# Patient Record
Sex: Female | Born: 1985 | Race: White | Hispanic: No | Marital: Married | Smoking: Never smoker
Health system: Southern US, Community
[De-identification: ages and names within clinical notes are randomized; demographics above are authoritative.]

## PROBLEM LIST (undated history)

## (undated) ENCOUNTER — Inpatient Hospital Stay (HOSPITAL_COMMUNITY): Payer: Self-pay

## (undated) DIAGNOSIS — K5150 Left sided colitis without complications: Secondary | ICD-10-CM

## (undated) DIAGNOSIS — E559 Vitamin D deficiency, unspecified: Secondary | ICD-10-CM

## (undated) DIAGNOSIS — K5100 Ulcerative (chronic) pancolitis without complications: Secondary | ICD-10-CM

## (undated) DIAGNOSIS — D500 Iron deficiency anemia secondary to blood loss (chronic): Secondary | ICD-10-CM

## (undated) DIAGNOSIS — N920 Excessive and frequent menstruation with regular cycle: Secondary | ICD-10-CM

## (undated) DIAGNOSIS — K602 Anal fissure, unspecified: Secondary | ICD-10-CM

## (undated) DIAGNOSIS — Z8759 Personal history of other complications of pregnancy, childbirth and the puerperium: Secondary | ICD-10-CM

## (undated) DIAGNOSIS — D693 Immune thrombocytopenic purpura: Secondary | ICD-10-CM

## (undated) HISTORY — DX: Left sided colitis without complications: K51.50

## (undated) HISTORY — DX: Excessive and frequent menstruation with regular cycle: N92.0

## (undated) HISTORY — DX: Anal fissure, unspecified: K60.2

## (undated) HISTORY — PX: TONSILLECTOMY: SUR1361

## (undated) HISTORY — DX: Immune thrombocytopenic purpura: D69.3

## (undated) HISTORY — DX: Iron deficiency anemia secondary to blood loss (chronic): D50.0

## (undated) HISTORY — DX: Vitamin D deficiency, unspecified: E55.9

## (undated) HISTORY — DX: Personal history of other complications of pregnancy, childbirth and the puerperium: Z87.59

---
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
## 2013-02-11 ENCOUNTER — Encounter: Payer: Self-pay | Admitting: Internal Medicine

## 2013-03-11 ENCOUNTER — Encounter: Payer: Self-pay | Admitting: Internal Medicine

## 2013-03-11 ENCOUNTER — Other Ambulatory Visit (INDEPENDENT_AMBULATORY_CARE_PROVIDER_SITE_OTHER): Payer: PRIVATE HEALTH INSURANCE

## 2013-03-11 ENCOUNTER — Ambulatory Visit (INDEPENDENT_AMBULATORY_CARE_PROVIDER_SITE_OTHER): Payer: PRIVATE HEALTH INSURANCE | Admitting: Internal Medicine

## 2013-03-11 VITALS — BP 106/80 | HR 68 | Ht 75.0 in | Wt 205.6 lb

## 2013-03-11 DIAGNOSIS — Z862 Personal history of diseases of the blood and blood-forming organs and certain disorders involving the immune mechanism: Secondary | ICD-10-CM

## 2013-03-11 DIAGNOSIS — R197 Diarrhea, unspecified: Secondary | ICD-10-CM

## 2013-03-11 DIAGNOSIS — K5150 Left sided colitis without complications: Secondary | ICD-10-CM

## 2013-03-11 DIAGNOSIS — K625 Hemorrhage of anus and rectum: Secondary | ICD-10-CM

## 2013-03-11 HISTORY — DX: Left sided colitis without complications: K51.50

## 2013-03-11 LAB — COMPREHENSIVE METABOLIC PANEL
ALT: 18 U/L (ref 0–35)
AST: 19 U/L (ref 0–37)
Alkaline Phosphatase: 60 U/L (ref 39–117)
Sodium: 139 mEq/L (ref 135–145)
Total Bilirubin: 0.6 mg/dL (ref 0.3–1.2)
Total Protein: 6.8 g/dL (ref 6.0–8.3)

## 2013-03-11 LAB — CBC WITH DIFFERENTIAL/PLATELET
Basophils Absolute: 0 10*3/uL (ref 0.0–0.1)
HCT: 36.7 % (ref 36.0–46.0)
Lymphs Abs: 1.9 10*3/uL (ref 0.7–4.0)
MCV: 86.8 fl (ref 78.0–100.0)
Monocytes Absolute: 0.7 10*3/uL (ref 0.1–1.0)
Platelets: 268 10*3/uL (ref 150.0–400.0)
RDW: 13.7 % (ref 11.5–14.6)

## 2013-03-11 MED ORDER — NA SULFATE-K SULFATE-MG SULF 17.5-3.13-1.6 GM/177ML PO SOLN: ORAL | Status: DC

## 2013-03-11 NOTE — Patient Instructions (Addendum)
You have been scheduled for a colonoscopy with propofol. Please follow written instructions given to you at your visit today.  Please pick up your prep kit at the pharmacy within the next 1-3 days. If you use inhalers (even only as needed), please bring them with you on the day of your procedure. Your physician has requested that you go to www.startemmi.com and enter the access code given to you at your visit today. This web site gives a general overview about your procedure. However, you should still follow specific instructions given to you by our office regarding your preparation for the procedure.  Your physician has requested that you go to the basement for the following lab work before leaving today: CBC,CMET, C-Reactive Protein  I appreciate the opportunity to care for you.

## 2013-03-11 NOTE — Progress Notes (Signed)
Subjective:    Patient ID: Deanna Willis, female    DOB: [birth year], 27 y.o.   MRN: [id number]  HPI Is a very nice 27 year old married white woman here with a three-year history of rectal bleeding and small amounts to moderate amounts of blood in the stool with cramps before defecation mucus. Stools are loose and diarrheal. About 3 years ago she was having some rectal bleeding and salt another gastroenterologist, was told she probably had an anal fissure and was prescribed a topical therapy. That didn't seem to help and she did not want to return there so she didn't. Over time things as worsened and she now has the loose stools, she cannot differentiate between gas and producing mucus or loose bloody stools. She has crampy abdominal pain. This occurs several times a day, she has about 5 bowel movements a day that are very small and loose as described.  No other pertinent GI history. She does have mild anal discomfort at times but nothing severe when she defecates.  She does have a history of idiopathic thrombocytopenia purpura, and what she says was a tendency towards von Willebrand's. She had 2 episodes of severe menorrhagia as a teenager, that were treated successfully with platelet infusion but she's not had problems like that in many years. Prior to that she tolerated a tonsillectomy at age 85 without serious bleeding. She has not had any invasive procedures since. She does have rare episodes of petechia, she said she had some significant itching and scratched it was in the last couple months and produce some petechia and will do bleeding on the skin. This is a rare event.  Allergies  Allergen Reactions  . Phenergan (Promethazine Hcl)     Fainting, syncope   No outpatient prescriptions prior to visit.   No facility-administered medications prior to visit.   Past Medical History  Diagnosis Date  . ITP (idiopathic thrombocytopenic purpura)     Question of associated tendency towards von  Willebrand  . Menorrhagia     Associated with thrombocytopenia, 2 episodes in teenage years responded the platelet infusion   Past Surgical History  Procedure Laterality Date  . Tonsillectomy     History   Social History  . Marital Status: Married    Spouse Name: N/A    Number of Children:  none   .     Occupational History  . Hairstylist    Social History Main Topics  . Smoking status: Never Smoker   . Smokeless tobacco: Never Used  . Alcohol Use: Yes  . Drug Use: No  . 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 Diabetes Father   . Diabetes Paternal Grandfather   . Diabetes Maternal Grandmother   . Heart disease Paternal Grandfather   . Heart disease Maternal Grandmother   . Irritable bowel syndrome Paternal Grandmother     Review of Systems As mentioned in the history of present illness, she also has some fatigue and myalgias at times. All other review of systems are negative.    Objective:   Physical Exam General:  Well-developed, well-nourished and in no acute distress Eyes:  anicteric. ENT:   Mouth and posterior pharynx free of lesions.  Neck:   supple w/o thyromegaly or mass.  Lungs: Clear to auscultation bilaterally. Heart:  S1S2, no rubs, murmurs, gallops. Abdomen:  soft,  non-tender, no hepatosplenomegaly, hernia, or mass and BS+.  Rectal: This is deferred until colonoscopy Lymph:  no cervical or supraclavicular adenopathy. Extremities:   no edema Skin   no rash. Neuro:  A&O x 3.  Psych:  appropriate mood and  Affect.      Assessment & Plan:   1. Rectal bleeding   2. Diarrhea   3. History of thrombocytopenia    1. The overall scenario suspicious for inflammatory bowel disease. This is explained to the patient and her husband who is with her today. 2. She will be scheduled for a colonoscopy.The risks and benefits as well as alternatives of  endoscopic procedure(s) have been discussed and reviewed. All questions answered. The patient agrees to proceed. 3. Also check a CBC, CMET and C-reactive protein. 4. Keep in mind his history of bleeding problems, my sense is that colonoscopy with mucosal biopsy should be okay. Certainly if her platelets are significantly low she could need something else. She could need further hematology followup depending upon will define as well.

## 2013-03-11 NOTE — Progress Notes (Signed)
Quick Note:  Labs are okay Please inform patient within the next few days ______

## 2013-03-24 ENCOUNTER — Ambulatory Visit (AMBULATORY_SURGERY_CENTER): Payer: Self-pay | Admitting: Internal Medicine

## 2013-03-24 ENCOUNTER — Encounter: Payer: Self-pay | Admitting: Internal Medicine

## 2013-03-24 VITALS — BP 117/58 | HR 56 | Temp 97.7°F | Resp 20 | Ht 75.0 in | Wt 205.0 lb

## 2013-03-24 DIAGNOSIS — K5190 Ulcerative colitis, unspecified, without complications: Secondary | ICD-10-CM

## 2013-03-24 DIAGNOSIS — R197 Diarrhea, unspecified: Secondary | ICD-10-CM

## 2013-03-24 DIAGNOSIS — K625 Hemorrhage of anus and rectum: Secondary | ICD-10-CM

## 2013-03-24 HISTORY — PX: COLONOSCOPY W/ BIOPSIES: SHX1374

## 2013-03-24 MED ORDER — SODIUM CHLORIDE 0.9 % IV SOLN: 500.0000 mL | INTRAVENOUS | Status: DC

## 2013-03-24 MED ORDER — MESALAMINE 1000 MG RE SUPP: 1000.0000 mg | Freq: Every day | RECTAL | Status: DC

## 2013-03-24 MED ORDER — MESALAMINE 1.2 G PO TBEC: 2.4000 g | DELAYED_RELEASE_TABLET | Freq: Every day | ORAL | Status: DC

## 2013-03-24 NOTE — Progress Notes (Signed)
Called to room to assist during endoscopic procedure.  Patient ID and intended procedure confirmed with present staff. Received instructions for my participation in the procedure from the performing physician.  

## 2013-03-24 NOTE — Progress Notes (Signed)
Lidocaine-40mg IV prior to Propofol InductionPropofol given over incremental dosages 

## 2013-03-24 NOTE — Patient Instructions (Addendum)
YOU HAD AN ENDOSCOPIC PROCEDURE TODAY AT THE [facility]: Refer to the procedure report that was given to you for any specific questions about what was found during the examination.  If the procedure report does not answer your questions, please call your gastroenterologist to clarify.  If you requested that your care partner not be given the details of your procedure findings, then the procedure report has been included in a sealed envelope for you to review at your convenience later.  YOU SHOULD EXPECT: Some feelings of bloating in the abdomen. Passage of more gas than usual.  Walking can help get rid of the air that was put into your GI tract during the procedure and reduce the bloating. If you had a lower endoscopy (such as a colonoscopy or flexible sigmoidoscopy) you may notice spotting of blood in your stool or on the toilet paper. If you underwent a bowel prep for your procedure, then you may not have a normal bowel movement for a few days.  DIET: Your first meal following the procedure should be a light meal and then it is ok to progress to your normal diet.  A half-sandwich or bowl of soup is an example of a good first meal.  Heavy or fried foods are harder to digest and may make you feel nauseous or bloated.  Likewise meals heavy in dairy and vegetables can cause extra gas to form and this can also increase the bloating.  Drink plenty of fluids but you should avoid alcoholic beverages for 24 hours.  ACTIVITY: Your care partner should take you home directly after the procedure.  You should plan to take it easy, moving slowly for the rest of the day.  You can resume normal activity the day after the procedure however you should NOT DRIVE or use heavy machinery for 24 hours (because of the sedation medicines used during the test).    SYMPTOMS TO REPORT IMMEDIATELY: A gastroenterologist can be reached at any hour.  During normal business hours, 8:30 AM to 5:00 PM Monday through Friday,  call [phone].  After hours and on weekends, please call the GI answering service at [phone] who will take a message and have the physician on call contact you.   Following lower endoscopy (colonoscopy or flexible sigmoidoscopy):  Excessive amounts of blood in the stool  Significant tenderness or worsening of abdominal pains  Swelling of the abdomen that is new, acute  Fever of 100F or higher  FOLLOW UP: If any biopsies were taken you will be contacted by phone or by letter within the next 1-3 weeks.  Call your gastroenterologist if you have not heard about the biopsies in 3 weeks.  Our staff will call the home number listed on your records the next business day following your procedure to check on you and address any questions or concerns that you may have at that time regarding the information given to you following your procedure. This is a courtesy call and so if there is no answer at the home number and we have not heard from you through the emergency physician on call, we will assume that you have returned to your regular daily activities without incident.  SIGNATURES/CONFIDENTIALITY: You and/or your care partner have signed paperwork which will be entered into your electronic medical record.  These signatures attest to the fact that that the information above on your After Visit Summary has been reviewed and is understood.  Full responsibility of the confidentiality of this   discharge information lies with you and/or your care-partner.  I found changes that look like ulcerative colitis - at the end of the colon and rectum. There was a small area of inflammation at the appendix also. 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  I am prescribing a medicated suppository to be inserted every night and also will start some Lialda - samples from my office - take 2 tablets every AM.  My office will contact you with biopsy results and arrange follow-up - you should  hear by next week.  This is treatable but requires chronic treatment, we need to give 8 weeks or more to see results of this treatment plan - I anticipate moving to all oral therapy if possible, and it usually is.  PooledIncome.pl is an excellent website to learn more from.  I appreciate the opportunity to care for you. Iva Boop, MD, [facility]

## 2013-03-24 NOTE — Progress Notes (Signed)
Patient did not experience any of the following events: a burn prior to discharge; a fall within the facility; wrong site/side/patient/procedure/implant event; or a hospital transfer or hospital admission upon discharge from the facility. (G8907) Patient did not have preoperative order for IV antibiotic SSI prophylaxis. (G8918)  

## 2013-03-24 NOTE — Op Note (Signed)
[facility] [address] [place], [address]   COLONOSCOPY PROCEDURE REPORT  PATIENT: Deanna Willis, Deanna Willis  MR#: [id number] BIRTHDATE: [birth year] , 26  yrs. old GENDER: Female ENDOSCOPIST: Iva Boop, MD, [facility] PROCEDURE DATE:  03/24/2013 PROCEDURE:   Colonoscopy with biopsy ASA CLASS:   Class I INDICATIONS:Rectal Bleeding and chronic diarrhea. MEDICATIONS: propofol (Diprivan) 400mg  IV, MAC sedation, administered by CRNA, and These medications were titrated to patient response per physician's verbal order  DESCRIPTION OF PROCEDURE:   After the risks benefits and alternatives of the procedure were thoroughly explained, informed consent was obtained.  A digital rectal exam revealed no abnormalities of the rectum.   The LB [id number]  endoscope was introduced through the anus and advanced to the terminal ileum which was intubated for a short distance. No adverse events experienced.   The quality of the prep was Suprep good  The instrument was then slowly withdrawn as the colon was fully examined.    COLON FINDINGS: Non-bleeding mucosal ulceration, continuous across the area examined, was present in the rectum.  Multiple biopsies were performed using cold forceps.   Non-bleeding mucosal ulceration, continuous across the area examined, was present in the sigmoid colon.  Multiple biopsies were performed.  Transition at 25 cm to normal colon.  Non-bleeding mucosal ulceration, intermittent across the area examined, was present at the appendiceal orifice. Multiple biopsies were performed using cold forceps.   The mucosa appeared normal in the terminal ileum.   The colon mucosa was otherwise normal, right colon retroflexion perdformed.  Random biopsies taken from normal areas. Retroflexed rectal views revealed proctitis. The time to cecum=3 minutes 20 seconds.  Withdrawal time=7 minutes 30 seconds.  The scope was withdrawn and the procedure  completed. COMPLICATIONS: There were no complications.  ENDOSCOPIC IMPRESSION: 1.   Non-bleeding mucosal ulceration in the rectum; multiple biopsies were performed using cold forceps 2.   Non-bleeding mucosal ulceration in the sigmoid colon; multiple biopsies were performed 3.   Non-bleeding mucosal ulceration at the appendiceal orifice; multiple biopsies were performed using cold forceps 4.   Normal mucosa in the terminal ileum 5.   The colon mucosa was otherwise normal  RECOMMENDATIONS: 1.  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 2.   Start Canasa 1000 mg nightly and Lialda 2.4 g daily   eSigned:  Iva Boop, MD, [facility] 03/24/2013 12:11 PM   cc: The Patient

## 2013-03-24 NOTE — Progress Notes (Signed)
Dr. Leone Payor and CRNA Brennan Bailey made aware that pt. Had 5 sips of water at 10:00 a.m. Pt. Denied drinking large amounts of water. Pt. Noted with tatoo on right inner forearm.

## 2013-03-25 ENCOUNTER — Telehealth: Payer: Self-pay | Admitting: Internal Medicine

## 2013-03-25 ENCOUNTER — Telehealth: Payer: Self-pay | Admitting: *Deleted

## 2013-03-25 NOTE — Telephone Encounter (Signed)
Left message that we called for f/u 

## 2013-03-25 NOTE — Telephone Encounter (Signed)
Dr Leone Payor, are there any alternatives to canasa that patient may try? They will cost over $600.00 monthly.

## 2013-03-25 NOTE — Telephone Encounter (Signed)
Yes - too expensive Lets see how the Lialda alone does

## 2013-03-25 NOTE — Telephone Encounter (Signed)
Left message for patient to call back  

## 2013-03-26 NOTE — Telephone Encounter (Signed)
Patient advised of Dr Marvell Fuller recommendations and she verbalizes understanding.

## 2013-04-01 ENCOUNTER — Encounter: Payer: Self-pay | Admitting: Internal Medicine

## 2013-04-01 NOTE — Progress Notes (Signed)
Quick Note:  Please let her know that biopsies agree that she has IBD - ulcerative colitis See me in 6-8 weeks - I would hope she is improved by end of this month if not getting better at about time she is to refill Lialda I will want to increase dose so have her call if not improving   [facility] no letter or recall ______

## 2013-05-12 ENCOUNTER — Ambulatory Visit (INDEPENDENT_AMBULATORY_CARE_PROVIDER_SITE_OTHER): Payer: Self-pay | Admitting: Internal Medicine

## 2013-05-12 ENCOUNTER — Encounter: Payer: Self-pay | Admitting: Internal Medicine

## 2013-05-12 VITALS — BP 110/80 | HR 80 | Ht 73.0 in | Wt 191.4 lb

## 2013-05-12 DIAGNOSIS — K5190 Ulcerative colitis, unspecified, without complications: Secondary | ICD-10-CM

## 2013-05-12 DIAGNOSIS — K5150 Left sided colitis without complications: Secondary | ICD-10-CM

## 2013-05-12 MED ORDER — MESALAMINE 1.2 G PO TBEC: 1.2000 g | DELAYED_RELEASE_TABLET | Freq: Every day | ORAL | Status: DC

## 2013-05-12 MED ORDER — MESALAMINE 1.2 G PO TBEC: 4.8000 g | DELAYED_RELEASE_TABLET | Freq: Every day | ORAL | Status: DC

## 2013-05-12 NOTE — Assessment & Plan Note (Addendum)
Improved but still with bleeding and mucous. Increase Lialda to 4.8 g daily and see me in 2 months If this is not successful consider short term rectal steroid vs. oral Could need a flex sig to guide - would definitely do that before starting more aggressive Tx. At some point needs vit D level - consider other immunizations, etc in future....especially if thinking about more immunosuppression

## 2013-05-12 NOTE — Progress Notes (Signed)
  Subjective:    Patient ID: Deanna Willis, female    DOB: 07-May-1986, 27 y.o.   MRN: [id number]  HPI Deanna Willis returns about 6 weeks after dx of left-sided ulcerative colitis mad at colonoscopy. She has been on Lialda 2.4 g daily. She has less urgency to defecate. She has noticed that it may be hard to have a "good" emptying bowel movement. She still has some rectal bleeding and mucous. She is definitely better, though. Less abdominal cramping, largely attributed to a diet for UC - she purchased a book about this - no gluten or dairy and other foods.  Canasa suppositories were rxed but were too expensive.  Medications, allergies, past medical history, past surgical history, family history and social history are reviewed and updated in the EMR.  Review of Systems As above    Objective:   Physical Exam WDWN NAD    Assessment & Plan:

## 2013-05-12 NOTE — Patient Instructions (Addendum)
We are giving you samples and also a printed rx for the Lialda.  Take 4.8g daily, either at one time or split up the dose.  We are also providing you with a coupon you may be able to use.  Follow up with [place] in 2 months.  I appreciate the opportunity to care for you.

## 2013-05-28 ENCOUNTER — Encounter: Payer: Self-pay | Admitting: Internal Medicine

## 2013-06-01 ENCOUNTER — Ambulatory Visit (INDEPENDENT_AMBULATORY_CARE_PROVIDER_SITE_OTHER): Payer: Self-pay | Admitting: Nurse Practitioner

## 2013-06-01 ENCOUNTER — Encounter: Payer: Self-pay | Admitting: Nurse Practitioner

## 2013-06-01 VITALS — BP 102/66 | HR 64 | Ht 72.0 in | Wt 191.6 lb

## 2013-06-01 DIAGNOSIS — K5150 Left sided colitis without complications: Secondary | ICD-10-CM

## 2013-06-01 DIAGNOSIS — K602 Anal fissure, unspecified: Secondary | ICD-10-CM

## 2013-06-01 NOTE — Patient Instructions (Addendum)
Willette Cluster NP-C will discuss another medication with Dr. Leone Payor.  We may call something else in different than Lialda.  We have given you samples also. We will also call in Prednisone and a Nitroglycerine ointment.   We made you a follow up appointment with Dr. Leone Payor for 06-29-2013 at 1:30 PM .

## 2013-06-01 NOTE — Progress Notes (Signed)
  History of Present Illness:  Deanna Willis is a 27 year old female diagnosed with UC early July. She ws started on 2 Lialda daily but still symptomatic at last visit mid August so dose increased to 4 tablets daily  Patient here with rectal burning, thinks she has a hemorrhoid and is using Prep H wipes with some relief. Additionally her rectal bleeding is unimproved and having postprandial loose stool 6-7 times a day.  No fevers.   Deanna Willis has ObamaCare which doesn't help with prescription coverage so couldn't get canasa which was $650.00. Lialda is 420.00 a month. She is working with [employer]  (makes Lialda) about drug assistance.   Colonoscopy 03/24/13 ENDOSCOPIC IMPRESSION:  1. Non-bleeding mucosal ulceration in the rectum; multiple  biopsies were performed using cold forceps  2. Non-bleeding mucosal ulceration in the sigmoid colon; multiple  biopsies were performed  3. Non-bleeding mucosal ulceration at the appendiceal orifice;  multiple biopsies were performed using cold forceps  4. Normal mucosa in the terminal ileum  5. The colon mucosa was otherwise normal  Current Medications, Allergies, Past Medical History, Past Surgical History, Family History and Social History were reviewed in [employer] record.  Physical Exam: General: Well developed , white female in no acute distress Head: Normocephalic and atraumatic Eyes:  sclerae anicteric, conjunctiva pink  Ears: Normal auditory acuity Lungs: Clear throughout to auscultation Heart: Regular rate and rhythm Abdomen: Soft, non distended, non-tender. No masses, no hepatomegaly. Normal bowel sounds Rectal: posterior midline fissure. DRE nor anoscopy performed.  Musculoskeletal: Symmetrical with no gross deformities  Extremities: No edema  Neurological: Alert oriented x 4, grossly nonfocal Psychological:  Alert and cooperative. Normal mood and affect  Assessment and Recommendations:  80. 27 year old female with  recently diagnosed UC, still symptomatic on 4.8gms lialda daily.  No insurance coverage, buying Lialda but costs over 400.00 a month. We called a local pharmacy about a less expensive mesalamine but found nothing significantly cheaper. More lialda samples given. Sulfasalazine worth considering but that can be discussed at follow up visit. For now will treat with course of Prednisone instead of a topical treatment as patient has painful fissure, see #2.   2. Posterior midline anal fissure. Begin BID NTG ointment 0.125mg . ROV in 3 weeks.

## 2013-06-02 ENCOUNTER — Telehealth: Payer: Self-pay | Admitting: *Deleted

## 2013-06-02 DIAGNOSIS — K602 Anal fissure, unspecified: Secondary | ICD-10-CM | POA: Insufficient documentation

## 2013-06-02 HISTORY — DX: Anal fissure, unspecified: K60.2

## 2013-06-02 NOTE — Telephone Encounter (Signed)
Called and left message for pt to advise we phoned in two prescriptions to [facility] [place], [facility], [place].  Tapered Prednisone and Nitroglycerin ointment 0.125 mg.  Cash price of the Ointment is 24.99.   LM for pt to call me if she has questions on the taper.

## 2013-06-02 NOTE — Telephone Encounter (Signed)
The patient called me back and asked me what a tapered dose means.  I explained it to her.  She was pleased the Nitroglycerin ointment wasn't more expensive than the 24.99.  She thanked me for letting her know about the perscriptions.

## 2013-06-03 NOTE — Progress Notes (Signed)
Agree with Ms. Guenther's assessment and plan. Cleotilde Spadaccini E. Hope Brandenburger, MD, FACG   

## 2013-06-14 ENCOUNTER — Encounter: Payer: Self-pay | Admitting: Nurse Practitioner

## 2013-06-17 ENCOUNTER — Encounter: Payer: Self-pay | Admitting: Internal Medicine

## 2013-06-24 ENCOUNTER — Telehealth: Payer: Self-pay | Admitting: Internal Medicine

## 2013-06-24 NOTE — Telephone Encounter (Signed)
Patient reports that the left side of her back "hurts to tough it".  She states that this started on Sunday am, by Monday was gone, now returned today.  States that the pain is not positional.  "feels like a bruise".  She also notes that since starting on prednisone her diarrhea has worsened.  She notes that even on higher doses that she had more diarrhea and urgency than she had before starting it.  She is advised that she should come in for an office visit tomorrow with Willette Cluster RNP .  She will be seen at 10:00

## 2013-06-25 ENCOUNTER — Ambulatory Visit (INDEPENDENT_AMBULATORY_CARE_PROVIDER_SITE_OTHER): Payer: Self-pay | Admitting: Nurse Practitioner

## 2013-06-25 ENCOUNTER — Other Ambulatory Visit (INDEPENDENT_AMBULATORY_CARE_PROVIDER_SITE_OTHER): Payer: Self-pay

## 2013-06-25 ENCOUNTER — Encounter: Payer: Self-pay | Admitting: Nurse Practitioner

## 2013-06-25 VITALS — BP 100/60 | HR 80 | Ht 72.0 in | Wt 188.5 lb

## 2013-06-25 DIAGNOSIS — K5100 Ulcerative (chronic) pancolitis without complications: Secondary | ICD-10-CM

## 2013-06-25 DIAGNOSIS — K625 Hemorrhage of anus and rectum: Secondary | ICD-10-CM

## 2013-06-25 DIAGNOSIS — K5289 Other specified noninfective gastroenteritis and colitis: Secondary | ICD-10-CM

## 2013-06-25 DIAGNOSIS — K529 Noninfective gastroenteritis and colitis, unspecified: Secondary | ICD-10-CM

## 2013-06-25 DIAGNOSIS — R197 Diarrhea, unspecified: Secondary | ICD-10-CM

## 2013-06-25 DIAGNOSIS — K602 Anal fissure, unspecified: Secondary | ICD-10-CM

## 2013-06-25 LAB — BASIC METABOLIC PANEL
BUN: 7 mg/dL (ref 6–23)
CO2: 28 mEq/L (ref 19–32)
GFR: 110.21 mL/min (ref >60.00)
Glucose, Bld: 86 mg/dL (ref 70–99)
Potassium: 3.6 mEq/L (ref 3.5–5.1)

## 2013-06-25 MED ORDER — HYOSCYAMINE SULFATE 0.125 MG SL SUBL: 0.1250 mg | SUBLINGUAL_TABLET | SUBLINGUAL | Status: DC | PRN

## 2013-06-25 NOTE — Progress Notes (Signed)
  History of Present Illness:  Deanna Willis is a 27 year old female diagnosed with UC early July. She was started on 2 Lialda daily but still symptomatic at so mid August dose increased to 4 tablets daily. Patient saw me 06/01/13, diagnosed with an anal fissure and prescribed NTG ointment which she has been using twice daily. She was still having having bloody loose stools so tapering course of Prednisone was prescribed.   Deanna Willis is worked in today for ongoing diarrhea. She is on lialda and 15th day of prednisone and hasn't really seen any improvement  She has crampy and multiple loose stools a day. She is still passing some blood and mucous. Since tapering steroids to 25mg  she has begun having nocturnal crampy diarrhea. No fevers. No joint aches though for last few days she has noticed that chest and back are tender to touch. She reports polyuria without dysuria. Appetite is suboptimal. Fissure was actually improving until she began having more diarrhea on prednisone taper.    Colonoscopy 03/24/13  ENDOSCOPIC IMPRESSION:  1. Non-bleeding mucosal ulceration in the rectum; multiple  biopsies were performed using cold forceps  2. Non-bleeding mucosal ulceration in the sigmoid colon; multiple  biopsies were performed  3. Non-bleeding mucosal ulceration at the appendiceal orifice;  multiple biopsies were performed using cold forceps  4. Normal mucosa in the terminal ileum  5. The colon mucosa was otherwise normal   Current Medications, Allergies, Past Medical History, Past Surgical History, Family History and Social History were reviewed in Deanna Willis record.  Physical Exam: General: pleasant well developed , white female in no acute distress Head: Normocephalic and atraumatic Eyes:  sclerae anicteric, conjunctiva pink  Ears: Normal auditory acuity Lungs: Clear throughout to auscultation Heart: Regular rate and rhythm Abdomen: Soft, non distended, non-tender. No masses, no  hepatomegaly. Normal bowel sounds Rectal: No improvement in posterior midline fissure.  Musculoskeletal: Symmetrical with no gross deformities  Extremities: No edema  Neurological: Alert oriented x 4, grossly nonfocal Psychological:  Alert and cooperative. Normal mood and affect  Assessment and Recommendations: 44. 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 Poor response to mesalamine which was increased in August. Prednisone taper added a couple of weeks ago and crampy diarrhea actually worse. Wonder if worsening diarrhea secondary to Mesalamine or if this is just worsening colitis.   Stop lialda  Maintain current prednisone dose of 25mg    Lomotil prn  Hyoscyamine prn  Add Vit D 1000mg  daily and Calcium 1200mg  daily  BMET to check electrolytes (diarrhea) and glucose (complains of polyuria).  Call us Monday with a condition update.  Follow up with Deanna Willis in 2-3 weeks. Patient may need headed towards biologic therapy.   2. Polyuria, likely from prednisone induced natriuresis but need to make rule out hyperglycemia. Await BMET.  3. Anal fissure. Continue BID NTG ointment. Hopefully will improve if we can get diarrhea under control. Anal fissuring does raise the question of whether this could be crohn's and not UC

## 2013-06-25 NOTE — Patient Instructions (Addendum)
We have sent the following medications to your pharmacy for you to pick up at your convenience: discontinue Lialda. We have sent Lomotil to your pharamacy. Take that 2-3 times daily as needed for diarrhea.  Continue taking your current dose of Prednisone. levins for cramping.   Your physician has requested that you go to the basement for the following lab work before leaving today: Bmet  Start taking 1000mg  of vitamin D 1200mg  of Calcium  These can be purchased over the counter.    You have a follow up appointment with Dr. Leone Payor on 07/29/2013 @ 9:45am                                               We are excited to introduce MyChart, a new best-in-class service that provides you online access to important information in your electronic medical record. We want to make it easier for you to view your health information - all in one secure location - when and where you need it. We expect MyChart will enhance the quality of care and service we provide.  When you register for MyChart, you can:    View your test results.    Request appointments and receive appointment reminders via email.    Request medication renewals.    View your medical history, allergies, medications and immunizations.    Communicate with your physician's office through a password-protected site.    Conveniently print information such as your medication lists.  To find out if MyChart is right for you, please talk to a member of our clinical staff today. We will gladly answer your questions about this free health and wellness tool.  If you are age 25 or older and want a member of your family to have access to your record, you must provide written consent by completing a proxy form available at our office. Please speak to our clinical staff about guidelines regarding accounts for patients younger than age 25.  As you activate your MyChart account and need any technical assistance, please call the MyChart technical  support line at [phone]) or email your question to [email or web address]. If you email your question(s), please include your name, a return phone number and the best time to reach you.  If you have non-urgent health-related questions, you can send a message to our office through MyChart at [email or web address]. If you have a medical emergency, call 911.  Thank you for using MyChart as your new health and wellness resource!   MyChart licensed from [employer],  [id number]. Patents Pending.

## 2013-06-26 ENCOUNTER — Telehealth: Payer: Self-pay | Admitting: Internal Medicine

## 2013-06-26 NOTE — Progress Notes (Signed)
Patient seen w/ Ms. Wilmon Pali. Agree She is to call me in 3 days with update.

## 2013-06-26 NOTE — Telephone Encounter (Signed)
Patient given results

## 2013-06-29 ENCOUNTER — Ambulatory Visit: Payer: PRIVATE HEALTH INSURANCE | Admitting: Internal Medicine

## 2013-07-02 ENCOUNTER — Encounter: Payer: Self-pay | Admitting: Internal Medicine

## 2013-07-06 ENCOUNTER — Encounter: Payer: Self-pay | Admitting: Internal Medicine

## 2013-07-13 ENCOUNTER — Other Ambulatory Visit: Payer: Self-pay | Admitting: Internal Medicine

## 2013-07-13 MED ORDER — PREDNISONE (PAK) 10 MG PO TABS: 10.0000 mg | ORAL_TABLET | Freq: Every day | ORAL | Status: DC

## 2013-07-21 ENCOUNTER — Ambulatory Visit (INDEPENDENT_AMBULATORY_CARE_PROVIDER_SITE_OTHER): Payer: Self-pay | Admitting: Internal Medicine

## 2013-07-21 ENCOUNTER — Encounter: Payer: Self-pay | Admitting: Internal Medicine

## 2013-07-21 VITALS — BP 110/60 | HR 76 | Ht 71.75 in | Wt 194.2 lb

## 2013-07-21 DIAGNOSIS — K602 Anal fissure, unspecified: Secondary | ICD-10-CM

## 2013-07-21 DIAGNOSIS — K5150 Left sided colitis without complications: Secondary | ICD-10-CM

## 2013-07-21 NOTE — Progress Notes (Signed)
  Subjective:    Patient ID: Deanna Willis, female    DOB: 1985/11/18, 26 y.o.   MRN: [id number]  HPI Deanna Willis is here for follow-ip. She is much better off Lials. Is on 25 mg prednisone. Still having diarrhea,but not always - "some almost formed" and controllable.There is  minimal if any rectal bleeding. Occasional cramps using hyoscyamine - often diet-related. Anal fissure is still there - topical NTG is helping. She has dropped her insurance and will soon choose another ndividual plan or go on her husband's plan.  Medications, allergies, past medical history, past surgical history, family history and social history are reviewed and updated in the EMR.   Review of Systems Nocturia has stopped after stopping Lialda.    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 0.125% NITROGLYCERIN OINTMENT, Disp: , Rfl: ;  hyoscyamine (LEVSIN SL) 0.125 MG SL tablet, Place 1 tablet (0.125 mg total) under the tongue every 4 (four) hours as needed for cramping., Disp: 60 tablet, Rfl: 1 predniSONE (STERAPRED UNI-PAK) 10 MG tablet, Take 1 tablet (10 mg total) by mouth daily. Take as directed previously, Disp: 100 tablet, Rfl: 0

## 2013-07-21 NOTE — Patient Instructions (Addendum)
Today you have been given information to read on Biologic Therapy.    It is the time of year to have a vaccination to prevent the flu (influenza virus).  Please have this done through your primary care provider or you can get this done at local pharmacies or the [facility]. It would be very helpful if you notify your primary care provider when and where you had the vaccination given by messaging them in My Chart, leaving a message or faxing the information.   Please follow up with [place] in 2 months.  Let us know when you get your new insurance.  I appreciate the opportunity to care for you.

## 2013-07-21 NOTE — Assessment & Plan Note (Addendum)
We reviewed Tx approach and it looks like mesalamine not an option due to diarrhea from Lialda though could try others she has not resolved on prednisone so favor biologic Tx . Risks and benefits reviewed some and handout provided.  We will plan to prescribe Humira once she sorts out insurance. Continue prednisone for now. She understands that is not a good long-term Tx.

## 2013-07-21 NOTE — Assessment & Plan Note (Signed)
Will need biologics

## 2013-07-21 NOTE — Assessment & Plan Note (Addendum)
Stable - continue NTG anticipate this should improve with disease and sx control of UC. Explained she could have Crohn's though endoscopic pattern not consistent but can be difficult. Consider IBD serology once insured again.Would not change anything now.

## 2013-07-28 ENCOUNTER — Encounter: Payer: Self-pay | Admitting: Internal Medicine

## 2013-07-29 ENCOUNTER — Ambulatory Visit: Payer: PRIVATE HEALTH INSURANCE | Admitting: Internal Medicine

## 2013-07-30 ENCOUNTER — Other Ambulatory Visit: Payer: Self-pay

## 2013-08-17 ENCOUNTER — Telehealth: Payer: Self-pay

## 2013-08-17 NOTE — Telephone Encounter (Signed)
Patient will not have insurance until after 09/24/13.  She is scheduled for an appt to discuss 10/01/12 9:45

## 2013-08-17 NOTE — Telephone Encounter (Signed)
Left message for patient to call back  

## 2013-08-17 NOTE — Telephone Encounter (Signed)
Error

## 2013-08-17 NOTE — Telephone Encounter (Signed)
Message copied by Annett Fabian on Mon Aug 17, 2013  3:46 PM ------      Message from: Stan Head E      Created: Mon Aug 17, 2013  1:49 PM      Regarding: Has she gotten coverage - plan to start biologic       Please check with her re: new insurance - has she gotten coverage             Is going to need f/u in Jan and we need to start biologic Tx ------

## 2013-09-08 ENCOUNTER — Other Ambulatory Visit: Payer: Self-pay | Admitting: Internal Medicine

## 2013-09-08 MED ORDER — PREDNISONE (PAK) 10 MG PO TABS: ORAL_TABLET | Freq: Every day | ORAL | Status: DC

## 2013-09-24 NOTE — L&D Delivery Note (Signed)
Operative Delivery Note  Was called to bedside for fetal deceleration nadir 80s. Exam was performed and it was noted that patient was completely dilated, completely effaced and +2 station.  Patient pushed form 15 minutes and there was worsening of the decelerations. Patient was verbally consented for an operative vaginal delivery. Risks and benefits discussed in detail. Risks include, but are not limited to the risks of anesthesia, bleeding, infection, damage to maternal tissues, fetal cephalhematoma.  There is also the risk of inability to effect vaginal delivery of the head, or shoulder dystocia that cannot be resolved by established maneuvers, leading to the need for emergency cesarean section. Verbal consent: obtained from patient.   The position of the fetal vertex was confirmed to be LOA. The foley catheter was removed. The epidural was found To be adequate. Peds was called to delivery.   The Kiwi vaginal vacuum was applied +3 station and a low vacuum was performed after three pulls with maternal effort with one pop off.  At 12:30 AM a viable female was delivered over intact perineum. There was a tight nuchal cord noted and was clamped on the perineum and cut.  There was another nuchal cord noted that was reduced.   The infant was immediately handed to waiting pediatricians.  The placenta was delivering immediately after the baby without effort. Cord gases was obtained quickly. The placenta was noted to have a large hematoma and therefore was sent to Pathology for evaluation.  Atony was noted immediately afterwards, this was alleviated by uterine massage and IV pitocin. Final EBL 500  APGAR: 4, 8; weight .   Placenta status: Intact, Spontaneous Pathology.   Cord: 3 vessels with the following complications: .  Cord pH: 7.0  Anesthesia: Epidural  Instruments: Kiwi vacuum Episiotomy: None Lacerations: 1st degree;Sulcus Suture Repair: 2.0 vicryl and 3.0 chromic Est. Blood Loss (mL): 500    Mom to postpartum.  Baby to Couplet care / Skin to Skin.  Deanna Willis Deanna Willis 05/26/2014, 1:15 AM

## 2013-10-01 ENCOUNTER — Other Ambulatory Visit (INDEPENDENT_AMBULATORY_CARE_PROVIDER_SITE_OTHER): Payer: BC Managed Care – PPO

## 2013-10-01 ENCOUNTER — Encounter: Payer: Self-pay | Admitting: Internal Medicine

## 2013-10-01 ENCOUNTER — Ambulatory Visit (INDEPENDENT_AMBULATORY_CARE_PROVIDER_SITE_OTHER): Payer: BC Managed Care – PPO | Admitting: Internal Medicine

## 2013-10-01 VITALS — BP 90/60 | HR 70 | Ht 73.0 in | Wt 192.0 lb

## 2013-10-01 DIAGNOSIS — Z3490 Encounter for supervision of normal pregnancy, unspecified, unspecified trimester: Secondary | ICD-10-CM

## 2013-10-01 DIAGNOSIS — K602 Anal fissure, unspecified: Secondary | ICD-10-CM

## 2013-10-01 DIAGNOSIS — K5150 Left sided colitis without complications: Secondary | ICD-10-CM

## 2013-10-01 DIAGNOSIS — D500 Iron deficiency anemia secondary to blood loss (chronic): Secondary | ICD-10-CM

## 2013-10-01 DIAGNOSIS — Z331 Pregnant state, incidental: Secondary | ICD-10-CM

## 2013-10-01 HISTORY — DX: Iron deficiency anemia secondary to blood loss (chronic): D50.0

## 2013-10-01 LAB — CBC WITH DIFFERENTIAL/PLATELET
BASOS PCT: 0.2 % (ref 0.0–3.0)
Basophils Absolute: 0 10*3/uL (ref 0.0–0.1)
EOS PCT: 3.8 % (ref 0.0–5.0)
Eosinophils Absolute: 0.4 10*3/uL (ref 0.0–0.7)
HCT: 29.2 % — ABNORMAL LOW (ref 36.0–46.0)
HEMOGLOBIN: 9.1 g/dL — AB (ref 12.0–15.0)
LYMPHS PCT: 23.2 % (ref 12.0–46.0)
Lymphs Abs: 2.2 10*3/uL (ref 0.7–4.0)
MCHC: 31.2 g/dL (ref 30.0–36.0)
Monocytes Absolute: 0.6 10*3/uL (ref 0.1–1.0)
Monocytes Relative: 6.3 % (ref 3.0–12.0)
NEUTROS ABS: 6.4 10*3/uL (ref 1.4–7.7)
Neutrophils Relative %: 66.5 % (ref 43.0–77.0)
Platelets: 437 10*3/uL — ABNORMAL HIGH (ref 150.0–400.0)
RBC: 4.51 Mil/uL (ref 3.87–5.11)
RDW: 17.9 % — ABNORMAL HIGH (ref 11.5–14.6)
WBC: 9.6 10*3/uL (ref 4.5–10.5)

## 2013-10-01 MED ORDER — MESALAMINE 1000 MG RE SUPP: 1000.0000 mg | Freq: Every day | RECTAL | Status: DC

## 2013-10-01 MED ORDER — PREDNISONE (PAK) 10 MG PO TABS: ORAL_TABLET | Freq: Every day | ORAL | Status: DC

## 2013-10-01 NOTE — Progress Notes (Signed)
Quick Note:  Will advise prenatal vitamin and ferrous sulfate. ______

## 2013-10-01 NOTE — Progress Notes (Addendum)
         Subjective:    Patient ID: Deanna Willis, female    DOB: 1985/10/19, 28 y.o.   MRN: [id number]  HPI Better overall - loose stols 50% of time Some rectal bleeding, no anal pain Overall better since becoming pregnant [redacted] weeks ago Has OB appt 1/30, Dr. Henderson CloudHorvath  Allergies  Allergen Reactions  . Phenergan [Promethazine Hcl]     Fainting, syncope   Outpatient Prescriptions Prior to Visit  Medication Sig Dispense Refill  . Acetaminophen (TYLENOL EXTRA STRENGTH PO) Take by mouth.      . AMBULATORY NON FORMULARY MEDICATION Place 1 application rectally 3 (three) times daily. 0.125% NITROGLYCERIN OINTMENT      . hyoscyamine (LEVSIN SL) 0.125 MG SL tablet Place 1 tablet (0.125 mg total) under the tongue every 4 (four) hours as needed for cramping.  60 tablet  1  . predniSONE (STERAPRED UNI-PAK) 10 MG tablet Take by mouth daily. Take as directed previously  100 tablet  0   No facility-administered medications prior to visit.   Past Medical History  Diagnosis Date  . ITP (idiopathic thrombocytopenic purpura)     Question of associated tendency towards von Willebrand  . Menorrhagia     Associated with thrombocytopenia, 2 episodes in teenage years responded the platelet infusion  . 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 Tonsillectomy    . Colonoscopy w/ biopsies  03/24/2013   History   Social History  . Marital Status: Married    Spouse Name: N/A    Number of Children: N/A  . Years of Education: N/A   Occupational History  . Hairstylist    Social History Main Topics  . Smoking status: Never Smoker   . Smokeless tobacco: Never Used  . Alcohol Use: Yes     Comment: OCC.  . Drug Use: No  . Sexual Activity: Yes   Other Topics Concern  . 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 Diabetes Father    . Diabetes Paternal Grandfather   . Heart disease Paternal Grandfather   . Diabetes Maternal Grandmother   . Heart disease Maternal Grandmother   . Irritable bowel syndrome Paternal Grandmother         Review of Systems As above    Objective:   Physical Exam General:  NAD Eyes:   anicteric Lungs:  clear Heart:  S1S2 no rubs, murmurs or gallops Abdomen:  soft and nontender, BS+ Ext:   no edema     Assessment & Plan:   1. 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 Anal fissure   3. Currently pregnant    Lab Results  Component Value Date   WBC 9.6 10/01/2013   HGB 9.1* 10/01/2013   HCT 29.2* 10/01/2013   MCV 64.8 Repeated and verified X2.* 10/01/2013   PLT 437.0* 10/01/2013   She has this microcytic anemia discovered today, will advise her to start her prenatal vitamins she is not on that plus ferrous sulfate 325 mg daily.

## 2013-10-01 NOTE — Assessment & Plan Note (Addendum)
symptomatically resolved.

## 2013-10-01 NOTE — Assessment & Plan Note (Addendum)
Improved but not in remission. She stopped her prednisone on her own because she was improving. I did review the possibility of adrenal crisis etc. she does not seem to have had that. I explained the need to taper off that medication. Now that she is pregnant I would not pursue biologic therapy though it's not contraindicated I think most likely using prednisone and some mesalamine make sense if we can. I've asked her to go back on prednisone 10 mg daily, and to take mesalamine suppository in the form of Canasa. I will see her back in 6 weeks. I explained that disease control is very important during pregnancy in that she may improve with a pregnancy state a low and for reasons that are not always clear. We discussed the possible problems with pregnancy associated with inflammatory bowel disease medications, though prednisone and mesalamine compounds are generally very well tolerated, I explained that there is most likely a higher risk of miscarriage etc. during  Her pregnancy since she has IBD.We'll await her first OB appointment for any input from Dr. Henderson CloudHorvath. Have referred the patient to the [facility] [employer] website for former information.

## 2013-10-01 NOTE — Patient Instructions (Addendum)
We have sent the following medications to your pharmacy for you to pick up at your convenience: Prednisone, Canasa suppositories   Your physician has requested that you go to the basement for the following lab work before leaving today: CBC/diff  You may learn more about pregnancy and inflammatory bowel diseases at [email or web address]   Follow up with us in six weeks.   I appreciate the opportunity to care for you.

## 2013-10-13 ENCOUNTER — Encounter: Payer: Self-pay | Admitting: Internal Medicine

## 2013-10-23 ENCOUNTER — Other Ambulatory Visit: Payer: Self-pay | Admitting: Obstetrics and Gynecology

## 2013-11-26 LAB — OB RESULTS CONSOLE ABO/RH: RH Type: POSITIVE

## 2013-11-26 LAB — OB RESULTS CONSOLE ANTIBODY SCREEN: ANTIBODY SCREEN: NEGATIVE

## 2013-11-26 LAB — OB RESULTS CONSOLE RPR: RPR: NONREACTIVE

## 2013-11-26 LAB — OB RESULTS CONSOLE RUBELLA ANTIBODY, IGM: RUBELLA: UNDETERMINED

## 2013-11-26 LAB — OB RESULTS CONSOLE GC/CHLAMYDIA
Chlamydia: NEGATIVE
Gonorrhea: NEGATIVE

## 2013-11-26 LAB — OB RESULTS CONSOLE HEPATITIS B SURFACE ANTIGEN: Hepatitis B Surface Ag: NEGATIVE

## 2013-11-26 LAB — OB RESULTS CONSOLE HIV ANTIBODY (ROUTINE TESTING): HIV: NONREACTIVE

## 2014-01-19 ENCOUNTER — Encounter (HOSPITAL_COMMUNITY): Payer: Self-pay | Admitting: *Deleted

## 2014-01-19 ENCOUNTER — Inpatient Hospital Stay (HOSPITAL_COMMUNITY)
Admission: AD | Admit: 2014-01-19 | Discharge: 2014-01-19 | Disposition: A | Discharge status: Home / Self Care | Payer: BC Managed Care – PPO | Source: Ambulatory Visit | Attending: Obstetrics and Gynecology | Admitting: Obstetrics and Gynecology

## 2014-01-19 DIAGNOSIS — L02419 Cutaneous abscess of limb, unspecified: Secondary | ICD-10-CM | POA: Insufficient documentation

## 2014-01-19 DIAGNOSIS — L0291 Cutaneous abscess, unspecified: Secondary | ICD-10-CM

## 2014-01-19 DIAGNOSIS — L03119 Cellulitis of unspecified part of limb: Secondary | ICD-10-CM

## 2014-01-19 DIAGNOSIS — A4902 Methicillin resistant Staphylococcus aureus infection, unspecified site: Secondary | ICD-10-CM | POA: Insufficient documentation

## 2014-01-19 DIAGNOSIS — L0390 Cellulitis, unspecified: Secondary | ICD-10-CM

## 2014-01-19 DIAGNOSIS — IMO0002 Reserved for concepts with insufficient information to code with codable children: Secondary | ICD-10-CM | POA: Insufficient documentation

## 2014-01-19 MED ORDER — CEPHALEXIN 500 MG PO CAPS: 500.0000 mg | ORAL_CAPSULE | Freq: Four times a day (QID) | ORAL | Status: DC

## 2014-01-19 NOTE — MAU Note (Signed)
Patient presents to MAU with c/o of a lump on right ankle that is red and sore to touch. States has had for a month but has grown in size over the past week. Denies LOF, VB, or contractions. Reports good fetal movement.

## 2014-01-19 NOTE — MAU Provider Note (Signed)
History     CSN: [id number]  Arrival date and time: 01/19/14 1653   First Provider Initiated Contact with Patient 01/19/14 1738      Chief Complaint  Patient presents with  . ankle swelling    HPI [name] is a 28 y.o. G1P0 at 5125w0d who presents to MAU today with complaint of swelling of her right ankle. She states that this started as a small bump and has progressed over 3-4 weeks. The area is red and tender to palpation. She has also note 2 other small spots similar in appearance to the beginning stages of this area of concern. She denies fever, abdominal pain or vaginal bleeding or LOF.   OB History   Grav Para Term Preterm Abortions TAB SAB Ect Mult Living   1               Past Medical History  Diagnosis Date  . ITP (idiopathic thrombocytopenic purpura)     Question of associated tendency towards von Willebrand  . Menorrhagia     Associated with thrombocytopenia, 2 episodes in teenage years responded the platelet infusion  . 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 Tonsillectomy    . Colonoscopy w/ biopsies  03/24/2013    Family History  Problem Relation Age of Onset  . Diabetes Father   . Diabetes Paternal Grandfather   . Heart disease Paternal Grandfather   . Diabetes Maternal Grandmother   . Heart disease Maternal Grandmother   . Irritable bowel syndrome Paternal Grandmother     History  Substance Use Topics  . Smoking status: Never Smoker   . Smokeless tobacco: Never Used  . Alcohol Use: Yes     Comment: OCC.    Allergies:  Allergies  Allergen Reactions  . Phenergan [Promethazine Hcl]     Fainting, syncope    No prescriptions prior to admission    Review of Systems  Constitutional: Negative for fever and malaise/fatigue.  Cardiovascular: Positive for leg swelling.  Gastrointestinal: Negative for abdominal pain.  Genitourinary:       Neg  - vaginal bleeding, abnormal discharge or LOF  Skin: Negative for rash.   Physical Exam   Blood pressure 108/76, pulse 68, temperature 98.1 F (36.7 C), temperature source Oral, resp. rate 16, height 6\' 1"  (1.854 m), weight 204 lb (92.534 kg), last menstrual period 04/15/2013, SpO2 100.00%.  Physical Exam  Constitutional: She is oriented to person, place, and time. She appears well-developed and well-nourished. No distress.  HENT:  Head: Normocephalic and atraumatic.  Cardiovascular: Normal rate.   Respiratory: Effort normal.  Musculoskeletal: She exhibits edema and tenderness.       Right ankle: She exhibits swelling. She exhibits normal range of motion, no ecchymosis, no deformity and no laceration. Tenderness.  Neurological: She is alert and oriented to person, place, and time.  Skin: Skin is warm and dry. There is erythema.  ~ 6 cm area of erythema on the inner left ankle. Mild tenderness to palpation. Not warm to touch. No fluctuance noted.   Psychiatric: She has a normal mood and affect.    MAU Course  Procedures None  MDM +FHTs Discussed with Dr. Henderson CloudHorvath. Collect MRSA culture today. Start on keflex for possible cellulitis  Assessment and Plan  A: Cellulitis vs MRSA infection  P: Discharge home Rx for Keflex given to patient Wound culture pending Patient advised to follow-up with Dr.  Henderson CloudHorvath as scheduled for routine prenatal care Patient may return to MAU as needed or if her condition were to change or worsen  Freddi StarrJulie N Ethier, PA-C  01/19/2014, 9:08 PM

## 2014-01-19 NOTE — Discharge Instructions (Signed)
Cellulitis °Cellulitis is an infection of the skin and the tissue under the skin. The infected area is usually red and tender. This happens most often in the arms and lower legs. °HOME CARE  °· Take your antibiotic medicine as told. Finish the medicine even if you start to feel better. °· Keep the infected arm or leg raised (elevated). °· Put a warm cloth on the area up to 4 times per day. °· Only take medicines as told by your doctor. °· Keep all doctor visits as told. °GET HELP RIGHT AWAY IF:  °· You have a fever. °· You feel very sleepy. °· You throw up (vomit) or have watery poop (diarrhea). °· You feel sick and have muscle aches and pains. °· You see red streaks on the skin coming from the infected area. °· Your red area gets bigger or turns a dark color. °· Your bone or joint under the infected area is painful after the skin heals. °· Your infection comes back in the same area or different area. °· You have a puffy (swollen) bump in the infected area. °· You have new symptoms. °MAKE SURE YOU:  °· Understand these instructions. °· Will watch your condition. °· Will get help right away if you are not doing well or get worse. °Document Released: 02/27/2008 Document Revised: 03/11/2012 Document Reviewed: 11/26/2011 °ExitCare® Patient Information ©2014 [employer]. ° °

## 2014-01-22 LAB — MRSA CULTURE: Culture: NO GROWTH

## 2014-02-24 ENCOUNTER — Encounter: Payer: Self-pay | Admitting: Internal Medicine

## 2014-03-01 ENCOUNTER — Other Ambulatory Visit (INDEPENDENT_AMBULATORY_CARE_PROVIDER_SITE_OTHER): Payer: BC Managed Care – PPO

## 2014-03-01 ENCOUNTER — Encounter: Payer: Self-pay | Admitting: Gastroenterology

## 2014-03-01 ENCOUNTER — Ambulatory Visit (INDEPENDENT_AMBULATORY_CARE_PROVIDER_SITE_OTHER): Payer: BC Managed Care – PPO | Admitting: Gastroenterology

## 2014-03-01 VITALS — BP 118/60 | HR 88 | Ht 72.0 in | Wt 219.8 lb

## 2014-03-01 DIAGNOSIS — K5190 Ulcerative colitis, unspecified, without complications: Secondary | ICD-10-CM

## 2014-03-01 LAB — CBC
HCT: 33.9 % — ABNORMAL LOW (ref 36.0–46.0)
Hemoglobin: 11.3 g/dL — ABNORMAL LOW (ref 12.0–15.0)
MCHC: 33.3 g/dL (ref 30.0–36.0)
MCV: 83 fl (ref 78.0–100.0)
PLATELETS: 277 10*3/uL (ref 150.0–400.0)
RBC: 4.08 Mil/uL (ref 3.87–5.11)
RDW: 16.7 % — ABNORMAL HIGH (ref 11.5–15.5)
WBC: 10.1 10*3/uL (ref 4.0–10.5)

## 2014-03-01 NOTE — Patient Instructions (Addendum)
Your physician has requested that you go to the basement for the following lab work before leaving today: CBC  Start your prednisone 40 mg that you have at home once daily   You have a follow up visit scheduled with Mike Gip, PA-C on 03-22-2014 at 930 am

## 2014-03-01 NOTE — Progress Notes (Addendum)
     03/01/2014 Deanna Willis [id number] [birth year]   History of Present Illness:   This is a pleasant 28 year old female who is [redacted] weeks pregnant and is known to Dr. Leone Payor for treatment of her left-sided ulcerative colitis. She had previously been using Canasa suppositories, but states that she has not really been having any colitis symptoms since January when she was last seen by Dr. Leone Payor. Approximately 2 weeks ago she started having abdominal cramping and bloody diarrhea. She states that she was having approximately 6 stools per day. She also noticed that she was very gassy. She called her OB for the approval to restart using the Canasa suppositories. They approved those and she has been using them with some mild improvement in her symptoms. She is down to about 4 bowel movements a day, but overall is still having loose and bloody stools. Prednisone had been discussed previously, but at that point it was very early in her pregnancy. The only other medication that she had been on in the past has Lialda, which she states made her symptoms much worse.  She was just diagnosed with this in July 2014 when she had her colonoscopy.    Current Medications, Allergies, Past Medical History, Past Surgical History, Family History and Social History were reviewed in [employer] record.   Physical Exam: BP 118/60  Pulse 88  Ht 6' (1.829 m)  Wt 219 lb 12.8 oz (99.701 kg)  BMI 29.80 kg/m2  LMP 04/15/2013 General: Well developed white female in no acute distress Head: Normocephalic and atraumatic Eyes:  Sclerae anicteric, conjunctiva pink  Ears: Normal auditory acuity Lungs: Clear throughout to auscultation Heart: Regular rate and rhythm Abdomen: Soft, gravid uterus.  Normal bowel sounds.  Non-tender. Musculoskeletal: Symmetrical with no gross deformities  Extremities: Swelling noted in B/L LE's (ankles), R>L. Neurological: Alert oriented x 4, grossly  non-focal Psychological:  Alert and cooperative. Normal mood and affect  Assessment and Recommendations: -Left sided UC, now with flare:  She will continue using Canasa suppositories.  I have discussed with Dr. Leone Payor who also saw and spoke with the patient.  We have decided to have her start prednisone 40 mg daily for one week (we have asked her to check with her OB first) then to call our office with an update and instructions on taper from there (if she is feeling better then can likely decreased to 30 mg daily).  Will check a CBC today.  Will return for office visit follow-up in one month as well.

## 2014-03-03 NOTE — Progress Notes (Signed)
Saw patient w/ Ms. Zehr - agree.

## 2014-03-15 ENCOUNTER — Encounter (HOSPITAL_COMMUNITY): Payer: Self-pay | Admitting: *Deleted

## 2014-03-15 ENCOUNTER — Inpatient Hospital Stay (HOSPITAL_COMMUNITY)
Admission: AD | Admit: 2014-03-15 | Discharge: 2014-03-15 | Disposition: A | Discharge status: Home / Self Care | Payer: BC Managed Care – PPO | Source: Ambulatory Visit | Attending: Obstetrics & Gynecology | Admitting: Obstetrics & Gynecology

## 2014-03-15 ENCOUNTER — Encounter: Payer: Self-pay | Admitting: Physician Assistant

## 2014-03-15 DIAGNOSIS — R197 Diarrhea, unspecified: Secondary | ICD-10-CM | POA: Insufficient documentation

## 2014-03-15 DIAGNOSIS — O4700 False labor before 37 completed weeks of gestation, unspecified trimester: Secondary | ICD-10-CM | POA: Insufficient documentation

## 2014-03-15 DIAGNOSIS — O99891 Other specified diseases and conditions complicating pregnancy: Secondary | ICD-10-CM | POA: Insufficient documentation

## 2014-03-15 DIAGNOSIS — O9989 Other specified diseases and conditions complicating pregnancy, childbirth and the puerperium: Principal | ICD-10-CM

## 2014-03-15 LAB — URINALYSIS, ROUTINE W REFLEX MICROSCOPIC
Bilirubin Urine: NEGATIVE
Glucose, UA: NEGATIVE mg/dL
Ketones, ur: 15 mg/dL — AB
Nitrite: NEGATIVE
PROTEIN: NEGATIVE mg/dL
Specific Gravity, Urine: 1.02 (ref 1.005–1.030)
UROBILINOGEN UA: 0.2 mg/dL (ref 0.0–1.0)
pH: 6 (ref 5.0–8.0)

## 2014-03-15 LAB — CBC WITH DIFFERENTIAL/PLATELET
BASOS PCT: 0 % (ref 0–1)
Basophils Absolute: 0.1 10*3/uL (ref 0.0–0.1)
EOS ABS: 0.6 10*3/uL (ref 0.0–0.7)
Eosinophils Relative: 3 % (ref 0–5)
HEMATOCRIT: 34.2 % — AB (ref 36.0–46.0)
Hemoglobin: 11.5 g/dL — ABNORMAL LOW (ref 12.0–15.0)
Lymphocytes Relative: 13 % (ref 12–46)
Lymphs Abs: 2.2 10*3/uL (ref 0.7–4.0)
MCH: 27.8 pg (ref 26.0–34.0)
MCHC: 33.6 g/dL (ref 30.0–36.0)
MCV: 82.6 fL (ref 78.0–100.0)
MONO ABS: 1.5 10*3/uL — AB (ref 0.1–1.0)
Monocytes Relative: 9 % (ref 3–12)
Neutro Abs: 12.5 10*3/uL — ABNORMAL HIGH (ref 1.7–7.7)
Neutrophils Relative %: 75 % (ref 43–77)
Platelets: 300 10*3/uL (ref 150–400)
RBC: 4.14 MIL/uL (ref 3.87–5.11)
RDW: 15.4 % (ref 11.5–15.5)
WBC: 16.8 10*3/uL — AB (ref 4.0–10.5)

## 2014-03-15 LAB — URINE MICROSCOPIC-ADD ON

## 2014-03-15 MED ORDER — DIPHENOXYLATE-ATROPINE 2.5-0.025 MG PO TABS: 2.0000 | ORAL_TABLET | Freq: Four times a day (QID) | ORAL | Status: DC | PRN

## 2014-03-15 MED ORDER — DIPHENOXYLATE-ATROPINE 2.5-0.025 MG PO TABS
2.0000 | ORAL_TABLET | Freq: Once | ORAL | Status: AC
  Administered 2014-03-15: 2 via ORAL
  Filled 2014-03-15: qty 2

## 2014-03-15 MED ORDER — LACTATED RINGERS IV BOLUS (SEPSIS)
1000.0000 mL | Freq: Once | INTRAVENOUS | Status: AC
  Administered 2014-03-15: 1000 mL via INTRAVENOUS

## 2014-03-15 MED ORDER — DIPHENOXYLATE-ATROPINE 2.5-0.025 MG PO TABS: 2.0000 | ORAL_TABLET | Freq: Once | ORAL | Status: DC

## 2014-03-15 NOTE — MAU Provider Note (Signed)
None     Chief Complaint:  Bloody diarrhea for 3 weeks  Deanna Willis is  28 y.o. G1P0 at 6344w6d presents complaining of intractable bloody diarrhea for 2-3 weeks.  She has a history of ulcerative colitis and sees Dr Leone PayorGessner.  She communicated with him via mychart and the suggestion was made to come to MAU for IVF and a stool sample   Obstetrical/Gynecological History: OB History   Grav Para Term Preterm Abortions TAB SAB Ect Mult Living   1              Past Medical History: Past Medical History  Diagnosis Date  . ITP (idiopathic thrombocytopenic purpura)     Question of associated tendency towards von Willebrand  . Menorrhagia     Associated with thrombocytopenia, 2 episodes in teenage years responded the platelet infusion  . 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 Tonsillectomy    . Colonoscopy w/ biopsies  03/24/2013    Family History: Family History  Problem Relation Age of Onset  . Diabetes Father   . Diabetes Paternal Grandfather   . Heart disease Paternal Grandfather   . Diabetes Maternal Grandmother   . Heart disease Maternal Grandmother   . Irritable bowel syndrome Paternal Grandmother   . Colon cancer Neg Hx   . Crohn's disease Neg Hx   . Kidney disease Neg Hx   . Liver disease Neg Hx     Social History: History  Substance Use Topics  . Smoking status: Never Smoker   . Smokeless tobacco: Never Used  . Alcohol Use: Yes     Comment: OCC.; none while pregnant    Allergies:  Allergies  Allergen Reactions  . Mesalamine Er     Allergy to the pill form, Oral; worsening colitis symptoms  . Phenergan [Promethazine Hcl]     Fainting, syncope    Meds:  Prescriptions prior to admission  Medication Sig Dispense Refill  . Acetaminophen (TYLENOL EXTRA STRENGTH PO) Take by mouth.      . loperamide (IMODIUM A-D) 2 MG tablet Take 2 mg by  mouth 4 (four) times daily as needed for diarrhea or loose stools.      . mesalamine (CANASA) 1000 MG suppository Place 1,000 mg rectally at bedtime. X 1 week      . OVER THE COUNTER MEDICATION Take 1 tablet by mouth 3 (three) times daily. Pt says she takes Garden of Life Prenatal.  Pt says she takes this three times a day.      . predniSONE (STERAPRED UNI-PAK) 10 MG tablet Take 30 tablets by mouth daily. Taking 30 mg a day until Sunday then taper to 20 mg then 10 mg then finish.        Review of Systems   Constitutional: Negative for fever and chills Eyes: Negative for visual disturbances Respiratory: Negative for shortness of breath, dyspnea Cardiovascular: Negative for chest pain or palpitations  Gastrointestinal: Negative for vomiting Genitourinary: Negative for dysuria and urgency Musculoskeletal: Negative for back pain, joint pain, myalgias  Neurological: Negative for dizziness and headaches     Physical Exam  Blood pressure 118/69, pulse 88, temperature 98.3 F (36.8 C), temperature source Oral, resp. rate 18, height 5\' 11"  (1.803 m), weight 95.936 kg (211 lb 8 oz), last menstrual period 04/15/2013. GENERAL: Well-developed, well-nourished female in no acute distress.  LUNGS: Clear to auscultation bilaterally.  HEART: Regular  rate and rhythm. ABDOMEN: Soft, nontender, nondistended, gravid.  EXTREMITIES: Nontender, no edema, 2+ distal pulses. DTR's 2+ CERVICAL EXAM: Dilatation 0cm   Effacement 0%   Station out of pelvis   Presentation: unsure FHT:  Baseline rate 150 bpm   Variability moderate  Accelerations present   Decelerations none Contractions: Had a few ctx upon arrival   Labs: Results for orders placed during the hospital encounter of 03/15/14 (from the past 24 hour(s))  URINALYSIS, ROUTINE W REFLEX MICROSCOPIC   Collection Time    03/15/14  8:00 PM      Result Value Ref Range   Color, Urine YELLOW  YELLOW   APPearance CLEAR  CLEAR   Specific Gravity, Urine 1.020   1.005 - 1.030   pH 6.0  5.0 - 8.0   Glucose, UA NEGATIVE  NEGATIVE mg/dL   Hgb urine dipstick TRACE (*) NEGATIVE   Bilirubin Urine NEGATIVE  NEGATIVE   Ketones, ur 15 (*) NEGATIVE mg/dL   Protein, ur NEGATIVE  NEGATIVE mg/dL   Urobilinogen, UA 0.2  0.0 - 1.0 mg/dL   Nitrite NEGATIVE  NEGATIVE   Leukocytes, UA TRACE (*) NEGATIVE  URINE MICROSCOPIC-ADD ON   Collection Time    03/15/14  8:00 PM      Result Value Ref Range   Squamous Epithelial / LPF FEW (*) RARE   WBC, UA 0-2  <3 WBC/hpf   RBC / HPF 0-2  <3 RBC/hpf   Bacteria, UA MANY (*) RARE   Urine-Other MUCOUS PRESENT    CBC WITH DIFFERENTIAL   Collection Time    03/15/14  8:40 PM      Result Value Ref Range   WBC 16.8 (*) 4.0 - 10.5 K/uL   RBC 4.14  3.87 - 5.11 MIL/uL   Hemoglobin 11.5 (*) 12.0 - 15.0 g/dL   HCT 11.934.2 (*) 14.736.0 - 82.946.0 %   MCV 82.6  78.0 - 100.0 fL   MCH 27.8  26.0 - 34.0 pg   MCHC 33.6  30.0 - 36.0 g/dL   RDW 56.215.4  13.011.5 - 86.515.5 %   Platelets 300  150 - 400 K/uL   Neutrophils Relative % 75  43 - 77 %   Neutro Abs 12.5 (*) 1.7 - 7.7 K/uL   Lymphocytes Relative 13  12 - 46 %   Lymphs Abs 2.2  0.7 - 4.0 K/uL   Monocytes Relative 9  3 - 12 %   Monocytes Absolute 1.5 (*) 0.1 - 1.0 K/uL   Eosinophils Relative 3  0 - 5 %   Eosinophils Absolute 0.6  0.0 - 0.7 K/uL   Basophils Relative 0  0 - 1 %   Basophils Absolute 0.1  0.0 - 0.1 K/uL   Imaging Studies:  No results found.  Assessment: Deanna Willis is  28 y.o. G1P0 at 6053w6d presents with bloody diarrhea, UC vs infectious process.  Discussed with Dr. Mora ApplPinn.  Plan: Stool culture/C Diff PCR pending Lomotil for sx relief per Dr. Leone PayorGessner F/U with Dr. Leone PayorGessner as scheduled   CRESENZO-DISHMAN,FRANCES 6/22/201510:24 PM

## 2014-03-15 NOTE — Discharge Instructions (Signed)
Bloody Diarrhea °Bloody diarrhea can be caused by many different conditions. Most of the time bloody diarrhea is the result of food poisoning or minor infections. Bloody diarrhea usually improves over 2 to 3 days of rest and fluid replacement. Other conditions that can cause bloody diarrhea include: °· Internal bleeding. °· Infection. °· Diseases of the bowel and colon. °Internal bleeding from an ulcer or bowel disease can be severe and requires hospital care or even surgery. °DIAGNOSIS  °To find out what is wrong your caregiver may check your: °· Stool. °· Blood. °· Results from a test that looks inside the body (endoscopy). °TREATMENT  °· Get plenty of rest. °· Drink enough water and fluids to keep your urine clear or pale yellow. °· Do not smoke. °· Solid foods and dairy products should be avoided until your illness improves. °· As you improve, slowly return to a regular diet with easily-digested foods first. Examples are: °¨ Bananas. °¨ Rice. °¨ Toast. °¨ Crackers. °You should only need these for about 2 days before adding more normal foods to your diet. °· Avoid spicy or fatty foods as well as caffeine and alcohol for several days. °· Medicine to control cramping and diarrhea can relieve symptoms but may prolong some cases of bloody diarrhea. Antibiotics can speed recovery from diarrhea due to some bacterial infections. Call your caregiver if diarrhea does not get better in 3 days. °SEEK MEDICAL CARE IF:  °· You do not improve after 3 days. °· Your diarrhea improves but your stool appears black. °SEEK IMMEDIATE MEDICAL CARE IF:  °· You become extremely weak or faint. °· You become very sweaty. °· You have increased pain or bleeding. °· You develop repeated vomiting. °· You vomit and you see blood or the vomit looks black in color. °· You have a fever. °Document Released: 09/10/2005 Document Revised: 12/03/2011 Document Reviewed: 08/12/2009 °ExitCare® Patient Information ©2015 [employer]. This information is  not intended to replace advice given to you by your health care provider. Make sure you discuss any questions you have with your health care provider. ° °

## 2014-03-15 NOTE — MAU Note (Signed)
PT  SAYS SHE HAS  BLOODY MUCUS  DIARRHEA-  STARTED 3 WEEKS AGO   AND WORSE  1-2 WEEKS.   CALLED  DR HORVATH- ON LAST Thursday-   SHE  TALKED  TO DR ROSS-  TOLD IF WORSE - COME TO HOSPITAL.   PT SAYS  GI - DR  Leone PayorGESSNECorinda Gubler-  [place].-     TALKED TO HIM TODAY-     TOLD HER TO COME HERE.-   SHE HAS HX OF ULCERATIVE COLITIS      SHE ALSO  FEELS ABD CRAMPS  AND  HURTS WHEN WALKING.     NO VOMITING..  SHE TAKES MEDS   AS SCHEDULE..Marland Kitchen

## 2014-03-16 ENCOUNTER — Telehealth: Payer: Self-pay | Admitting: *Deleted

## 2014-03-16 LAB — CLOSTRIDIUM DIFFICILE BY PCR: CDIFFPCR: NEGATIVE

## 2014-03-16 NOTE — Telephone Encounter (Signed)
I sent a message and left a voicemail Needs a check for C diff PCR Also may need to go to hospital if feeling bad as her OB told her We need to reach out again 6/23 ----- Message ----- From: Lyman SpellerEmily A. Hyacinth MeekerMiller Sent: 03/15/2014 3:58 PM To: Amy Oswald HillockS Esterwood, PA-C, Iva Booparl E Gessner, MD Subject: Visit Follow-Up Question  Per EPIC record [facility]

## 2014-03-16 NOTE — MAU Provider Note (Signed)
Reviewed case with CNM, and I agree with above note. Per GI patient to be placed on lamotil and she will f/u in his office.  Patient given return precautions.   PINN, WALDA [name]

## 2014-03-17 ENCOUNTER — Ambulatory Visit (INDEPENDENT_AMBULATORY_CARE_PROVIDER_SITE_OTHER): Payer: BC Managed Care – PPO | Admitting: Internal Medicine

## 2014-03-17 ENCOUNTER — Encounter: Payer: Self-pay | Admitting: Internal Medicine

## 2014-03-17 ENCOUNTER — Other Ambulatory Visit (INDEPENDENT_AMBULATORY_CARE_PROVIDER_SITE_OTHER): Payer: BC Managed Care – PPO | Admitting: Internal Medicine

## 2014-03-17 VITALS — BP 96/70 | HR 88 | Ht 71.75 in | Wt 206.5 lb

## 2014-03-17 DIAGNOSIS — Z3493 Encounter for supervision of normal pregnancy, unspecified, third trimester: Secondary | ICD-10-CM | POA: Insufficient documentation

## 2014-03-17 DIAGNOSIS — K5150 Left sided colitis without complications: Secondary | ICD-10-CM

## 2014-03-17 MED ORDER — PREDNISONE (PAK) 10 MG PO TABS: 4.0000 | ORAL_TABLET | Freq: Every day | ORAL | Status: DC

## 2014-03-17 MED ORDER — HYDROCORTISONE 100 MG/60ML RE ENEM: 1.0000 | ENEMA | Freq: Every day | RECTAL | Status: DC

## 2014-03-17 MED ORDER — DIPHENOXYLATE-ATROPINE 2.5-0.025 MG PO TABS: 2.0000 | ORAL_TABLET | Freq: Four times a day (QID) | ORAL | Status: DC | PRN

## 2014-03-17 NOTE — Progress Notes (Signed)
   Subjective:    Patient ID: Deanna Willis, female    DOB: 1986/03/06, 28 y.o.   MRN: [id number]  HPI  Irving Burtonmily is here for folIrving Burtonlowup. She's had a rough time over the past couple of weeks, despite starting prednisone on top of Canasa suppositories she still having frequent mucoid and bloody stools. Hemoglobin has been stable. [facility] and was given some IV fluids and had stool studies for C. differential and culture which are negative. She had Lomotil started which doesn't seem to be helping much. [facility] and they thought that that was a reasonable adjunct treatment. She is concerned because she has lost some weight since June 8. The baby is moving fine, there is an OB followup, next week or 2.  Wt Readings from Last 3 Encounters:  03/17/14 206 lb 8 oz (93.668 kg)  03/15/14 211 lb 8 oz (95.936 kg)  03/01/14 219 lb 12.8 oz (99.701 kg)   Medications, allergies, past medical history, past surgical history, family history and social history are reviewed and updated in the EMR.    Review of Systems As above no fevers.    Objective:   Physical Exam General:  NAD Eyes:   anicteric Lungs:  clear Heart:  S1S2 no rubs, murmurs or gallops Abdomen:  Gravid soft and nontender, BS+ Ext:   no edema    Data Reviewed:  Hospital records labs as above Lab Results  Component Value Date   WBC 16.8* 03/15/2014   HGB 11.5* 03/15/2014   HCT 34.2* 03/15/2014   MCV 82.6 03/15/2014   PLT 300 03/15/2014      Assessment & Plan:  Left sided ulcerative (chronic) colitis w/ appendiceal inflammaition Flaring Prednisone 40 mg qd is to be an increase Discontinue Canasa. She did seem to have diarrhea when she was on oral mesalamine for perhaps the Canasa is causing problems to. Start cortenema nightly She will follow with me through my chart or phone call in 1 week, I will see her again in early July. If she is not responding that I think we may  need to start Cimzia,  A biologic that does not cross the placenta. PPD today, she was screened for hepatitis B and antigen was negative on 11/26/2013. I've explained the risks benefits and indications of biologic therapy in ulcerative colitis. I've given her handouts about this as well as IBD in pregnancy from the Cro[employer]'s [employer] colitis [employer] association.  I will copy Dr. Henderson CloudHorvath   Pregnant and not yet delivered in third trimester Some weight loss I will call Dr. Henderson CloudHorvath  Current outpatient prescriptions:Acetaminophen (TYLENOL EXTRA STRENGTH PO), Take by mouth., Disp: , Rfl: ;  OVER THE COUNTER MEDICATION, Take 1 tablet by mouth 3 (three) times daily. Pt says she takes Garden of Life Prenatal.  Pt says she takes this three times a day., Disp: , Rfl: ;  predniSONE (STERAPRED UNI-PAK) 10 MG tablet, Take 4 tablets (40 mg total) by mouth daily. Will taper as directed, Disp: 100 tablet, Rfl: 1 diphenoxylate-atropine (LOMOTIL) 2.5-0.025 MG per tablet, Take 2 tablets by mouth 4 (four) times daily as needed for diarrhea or loose stools (1-2 tabs)., Disp: 120 tablet, Rfl: 0;  hydrocortisone (CORTENEMA) 100 MG/60ML enema, Place 1 enema (100 mg total) rectally at bedtime., Disp: 60 mL, Rfl: 3

## 2014-03-17 NOTE — Patient Instructions (Addendum)
  Today you have been given a PPD test and we need you to come back by here Friday AM for us to read it.  We will request your labs from Dr. Deniece ReeHovath's office.  We are giving you reading information on Biologic therapy and Pregnancy/IBD.  You have a follow up appointment with us on 04/01/14 at 9;30AM.  We have faxed a Lomotil rx to your pharmacy.   I appreciate the opportunity to care for you.

## 2014-03-17 NOTE — Assessment & Plan Note (Addendum)
Flaring Prednisone 40 mg qd is to be an increase Discontinue Canasa. She did seem to have diarrhea when she was on oral mesalamine for perhaps the Canasa is causing problems to. Start cortenema nightly She will follow with me through my chart or phone call in 1 week, I will see her again in early July. If she is not responding that I think we may need to start Cimzia,  A biologic that does not cross the placenta. PPD today, she was screened for hepatitis B and antigen was negative on 11/26/2013. I've explained the risks benefits and indications of biologic therapy in ulcerative colitis. I've given her handouts about this as well as IBD in pregnancy from the Crohn's and colitis [employer] association.  I will copy Dr. Henderson CloudHorvath

## 2014-03-17 NOTE — Assessment & Plan Note (Addendum)
Some weight loss I will call Dr. Henderson CloudHorvath

## 2014-03-19 ENCOUNTER — Encounter (HOSPITAL_COMMUNITY): Payer: Self-pay | Admitting: General Practice

## 2014-03-19 ENCOUNTER — Inpatient Hospital Stay (HOSPITAL_COMMUNITY)
Admission: AD | Admit: 2014-03-19 | Discharge: 2014-03-19 | Disposition: A | Discharge status: Home / Self Care | Payer: BC Managed Care – PPO | Source: Ambulatory Visit | Attending: Obstetrics and Gynecology | Admitting: Obstetrics and Gynecology

## 2014-03-19 ENCOUNTER — Encounter: Payer: Self-pay | Admitting: Nurse Practitioner

## 2014-03-19 DIAGNOSIS — O2343 Unspecified infection of urinary tract in pregnancy, third trimester: Secondary | ICD-10-CM

## 2014-03-19 DIAGNOSIS — R109 Unspecified abdominal pain: Secondary | ICD-10-CM | POA: Insufficient documentation

## 2014-03-19 DIAGNOSIS — O9989 Other specified diseases and conditions complicating pregnancy, childbirth and the puerperium: Principal | ICD-10-CM

## 2014-03-19 DIAGNOSIS — Z3493 Encounter for supervision of normal pregnancy, unspecified, third trimester: Secondary | ICD-10-CM

## 2014-03-19 DIAGNOSIS — O4700 False labor before 37 completed weeks of gestation, unspecified trimester: Secondary | ICD-10-CM | POA: Insufficient documentation

## 2014-03-19 DIAGNOSIS — K51911 Ulcerative colitis, unspecified with rectal bleeding: Secondary | ICD-10-CM

## 2014-03-19 DIAGNOSIS — O99891 Other specified diseases and conditions complicating pregnancy: Secondary | ICD-10-CM | POA: Insufficient documentation

## 2014-03-19 DIAGNOSIS — K5190 Ulcerative colitis, unspecified, without complications: Secondary | ICD-10-CM | POA: Insufficient documentation

## 2014-03-19 DIAGNOSIS — R197 Diarrhea, unspecified: Secondary | ICD-10-CM | POA: Insufficient documentation

## 2014-03-19 LAB — URINALYSIS, ROUTINE W REFLEX MICROSCOPIC
Glucose, UA: NEGATIVE mg/dL
Ketones, ur: NEGATIVE mg/dL
NITRITE: NEGATIVE
PH: 6 (ref 5.0–8.0)
Protein, ur: NEGATIVE mg/dL
SPECIFIC GRAVITY, URINE: 1.015 (ref 1.005–1.030)
UROBILINOGEN UA: 1 mg/dL (ref 0.0–1.0)

## 2014-03-19 LAB — CBC
HCT: 36.3 % (ref 36.0–46.0)
Hemoglobin: 12.2 g/dL (ref 12.0–15.0)
MCH: 27.8 pg (ref 26.0–34.0)
MCHC: 33.6 g/dL (ref 30.0–36.0)
MCV: 82.7 fL (ref 78.0–100.0)
PLATELETS: 327 10*3/uL (ref 150–400)
RBC: 4.39 MIL/uL (ref 3.87–5.11)
RDW: 15 % (ref 11.5–15.5)
WBC: 16.7 10*3/uL — ABNORMAL HIGH (ref 4.0–10.5)

## 2014-03-19 LAB — COMPREHENSIVE METABOLIC PANEL
ALT: 9 U/L (ref 0–35)
AST: 11 U/L (ref 0–37)
Albumin: 2.2 g/dL — ABNORMAL LOW (ref 3.5–5.2)
Alkaline Phosphatase: 93 U/L (ref 39–117)
BUN: 5 mg/dL — ABNORMAL LOW (ref 6–23)
CALCIUM: 8.9 mg/dL (ref 8.4–10.5)
CO2: 25 meq/L (ref 19–32)
Chloride: 98 mEq/L (ref 96–112)
Creatinine, Ser: 0.6 mg/dL (ref 0.50–1.10)
Glucose, Bld: 82 mg/dL (ref 70–99)
Potassium: 4.3 mEq/L (ref 3.7–5.3)
Sodium: 134 mEq/L — ABNORMAL LOW (ref 137–147)
TOTAL PROTEIN: 6.3 g/dL (ref 6.0–8.3)
Total Bilirubin: 0.2 mg/dL — ABNORMAL LOW (ref 0.3–1.2)

## 2014-03-19 LAB — URINE MICROSCOPIC-ADD ON

## 2014-03-19 LAB — STOOL CULTURE: Special Requests: NORMAL

## 2014-03-19 LAB — TB SKIN TEST
INDURATION: 0 mm
TB SKIN TEST: NEGATIVE

## 2014-03-19 LAB — FETAL FIBRONECTIN: Fetal Fibronectin: NEGATIVE

## 2014-03-19 MED ORDER — CEFUROXIME AXETIL 250 MG PO TABS: 250.0000 mg | ORAL_TABLET | Freq: Two times a day (BID) | ORAL | Status: DC

## 2014-03-19 MED ORDER — LACTATED RINGERS IV BOLUS (SEPSIS)
1000.0000 mL | Freq: Once | INTRAVENOUS | Status: AC
Start: 2014-03-19 — End: 2014-03-19
  Administered 2014-03-19: 1000 mL via INTRAVENOUS

## 2014-03-19 MED ORDER — BUTORPHANOL TARTRATE 1 MG/ML IJ SOLN
2.0000 mg | INTRAMUSCULAR | Status: AC
  Administered 2014-03-19: 2 mg via INTRAVENOUS
  Filled 2014-03-19: qty 2

## 2014-03-19 MED ORDER — OXYCODONE-ACETAMINOPHEN 5-325 MG PO TABS: 1.0000 | ORAL_TABLET | Freq: Four times a day (QID) | ORAL | Status: DC | PRN

## 2014-03-19 MED ORDER — CEFUROXIME AXETIL 250 MG PO TABS
250.0000 mg | ORAL_TABLET | ORAL | Status: AC
  Administered 2014-03-19: 250 mg via ORAL
  Filled 2014-03-19: qty 1

## 2014-03-19 NOTE — MAU Provider Note (Signed)
Chief Complaint:  Diarrhea   First Provider Initiated Contact with Patient 03/19/14 1730      HPI: Deanna Willis is a 28 y.o. G1P0 at [redacted]w[redacted]d who presents to maternity admissions reporting flare-up of ulcerative colitis starting 3-4 weeks ago.  She has had 20 bowel movements in the last 24 hours, describing them as bloody diarrhea.  She also has abdominal cramping which is becoming more severe.  She has GI follow up and has appointment next week. She is currently taking Lomotil and prednisone to manage her symptoms but they are worsening. She reports she cannot keep down any food, but has kept down some liquids today.  She reports good fetal movement, denies LOF, vaginal bleeding, vaginal itching/burning, urinary symptoms, h/a, dizziness, or fever/chills.      Past Medical History: Past Medical History  Diagnosis Date  . ITP (idiopathic thrombocytopenic purpura)     Question of associated tendency towards von Willebrand  . Menorrhagia     Associated with thrombocytopenia, 2 episodes in teenage years responded the platelet infusion  . Left sided ulcerative (chronic) colitis w/ appendiceal inflammaition 03/11/2013    Past obstetric history: OB History  Gravida Para Term Preterm AB SAB TAB Ectopic Multiple Living  1             # Outcome Date GA Lbr Len/2nd Weight Sex Delivery Anes PTL Lv  1 CUR               Past Surgical History: Past Surgical History  Procedure Laterality Date  . Tonsillectomy    . Colonoscopy w/ biopsies  03/24/2013    Family History: Family History  Problem Relation Age of Onset  . Diabetes Father   . Diabetes Paternal Grandfather   . Heart disease Paternal Grandfather   . Diabetes Maternal Grandmother   . Heart disease Maternal Grandmother   . Irritable bowel syndrome Paternal Grandmother   . Colon cancer Neg Hx   . Crohn's disease Neg Hx   . Kidney disease Neg Hx   . Liver disease Neg Hx     Social History: History  Substance Use Topics  .  Smoking status: Never Smoker   . Smokeless tobacco: Never Used  . Alcohol Use: Yes     Comment: OCC.; none while pregnant    Allergies:  Allergies  Allergen Reactions  . Mesalamine Er     Allergy to the pill form, Oral; worsening colitis symptoms  . Phenergan [Promethazine Hcl]     Fainting, syncope    Meds:  Prescriptions prior to admission  Medication Sig Dispense Refill  . Acetaminophen (TYLENOL PO) Take 2 tablets by mouth 3 (three) times daily as needed (cramping).      . diphenoxylate-atropine (LOMOTIL) 2.5-0.025 MG per tablet Take 2 tablets by mouth 4 (four) times daily as needed for diarrhea or loose stools (1-2 tabs).  120 tablet  0  . hydrocortisone (CORTENEMA) 100 MG/60ML enema Place 1 enema (100 mg total) rectally at bedtime.  60 mL  3  . OVER THE COUNTER MEDICATION Take 3 tablets by mouth. Garden of Life Prenatal has iron and probiotic in it.      . predniSONE (STERAPRED UNI-PAK) 10 MG tablet Take 4 tablets (40 mg total) by mouth daily. Will taper as directed  100 tablet  1  . ranitidine (ZANTAC) 300 MG tablet Take 300 mg by mouth daily as needed for heartburn.        ROS: Pertinent findings in history of present  illness.  Physical Exam  Blood pressure 121/76, pulse 96, temperature 98.4 F (36.9 C), temperature source Oral, resp. rate 18, weight 93.895 kg (207 lb), last menstrual period 04/15/2013. GENERAL: Well-developed, well-nourished female in no acute distress.  HEENT: normocephalic HEART: normal rate RESP: normal effort ABDOMEN: Soft, non-tender, gravid appropriate for gestational age EXTREMITIES: Nontender, no edema NEURO: alert and oriented Pelvic exam: Cervix pink, visually closed, without lesion, scant white creamy discharge, vaginal walls and external genitalia normal, FFN collected Cervix 0/long/high, firm, posterior Dilation: Closed Effacement (%): Thick Station: -3 Exam by:: dr Henderson Cloudhorvath  FHT:  Baseline 150 , moderate variability, accelerations  present (15x15), no decelerations Contractions: irritability noted, lasting 30 seconds or less every 1-4 minutes   Labs: Results for orders placed during the hospital encounter of 03/19/14 (from the past 24 hour(s))  URINALYSIS, ROUTINE W REFLEX MICROSCOPIC     Status: Abnormal   Collection Time    03/19/14  4:45 PM      Result Value Ref Range   Color, Urine YELLOW  YELLOW   APPearance CLOUDY (*) CLEAR   Specific Gravity, Urine 1.015  1.005 - 1.030   pH 6.0  5.0 - 8.0   Glucose, UA NEGATIVE  NEGATIVE mg/dL   Hgb urine dipstick SMALL (*) NEGATIVE   Bilirubin Urine SMALL (*) NEGATIVE   Ketones, ur NEGATIVE  NEGATIVE mg/dL   Protein, ur NEGATIVE  NEGATIVE mg/dL   Urobilinogen, UA 1.0  0.0 - 1.0 mg/dL   Nitrite NEGATIVE  NEGATIVE   Leukocytes, UA MODERATE (*) NEGATIVE  URINE MICROSCOPIC-ADD ON     Status: Abnormal   Collection Time    03/19/14  4:45 PM      Result Value Ref Range   Squamous Epithelial / LPF FEW (*) RARE   WBC, UA 11-20  <3 WBC/hpf   RBC / HPF 0-2  <3 RBC/hpf   Bacteria, UA MANY (*) RARE  CBC     Status: Abnormal   Collection Time    03/19/14  5:39 PM      Result Value Ref Range   WBC 16.7 (*) 4.0 - 10.5 K/uL   RBC 4.39  3.87 - 5.11 MIL/uL   Hemoglobin 12.2  12.0 - 15.0 g/dL   HCT 16.136.3  09.636.0 - 04.546.0 %   MCV 82.7  78.0 - 100.0 fL   MCH 27.8  26.0 - 34.0 pg   MCHC 33.6  30.0 - 36.0 g/dL   RDW 40.915.0  81.111.5 - 91.415.5 %   Platelets 327  150 - 400 K/uL  COMPREHENSIVE METABOLIC PANEL     Status: Abnormal   Collection Time    03/19/14  5:39 PM      Result Value Ref Range   Sodium 134 (*) 137 - 147 mEq/L   Potassium 4.3  3.7 - 5.3 mEq/L   Chloride 98  96 - 112 mEq/L   CO2 25  19 - 32 mEq/L   Glucose, Bld 82  70 - 99 mg/dL   BUN 5 (*) 6 - 23 mg/dL   Creatinine, Ser 7.820.60  0.50 - 1.10 mg/dL   Calcium 8.9  8.4 - 95.610.5 mg/dL   Total Protein 6.3  6.0 - 8.3 g/dL   Albumin 2.2 (*) 3.5 - 5.2 g/dL   AST 11  0 - 37 U/L   ALT 9  0 - 35 U/L   Alkaline Phosphatase 93  39 - 117  U/L   Total Bilirubin 0.2 (*) 0.3 - 1.2 mg/dL  GFR calc non Af Amer >90  >90 mL/min   GFR calc Af Amer >90  >90 mL/min  FETAL FIBRONECTIN     Status: None   Collection Time    03/19/14  5:53 PM      Result Value Ref Range   Fetal Fibronectin NEGATIVE  NEGATIVE    Assessment: 1. Exacerbation of ulcerative colitis, with rectal bleeding   2. Pregnant and not yet delivered in third trimester     Plan: LR x1000 ml, Stadol 2 mg IV. Pt reports pain resolved following fluids/medication. Consult Dr Henderson CloudHorvath D/C home with PTL precautions Percocet 5/325, take 1-2 tabs Q 6 hours PRN x20 tabs Ceftin 250 mg PO BID x7 days     Follow-up Information   Follow up with HORVATH,MICHELLE A, MD. (As scheduled)    Specialty:  Obstetrics and Gynecology   Contact information:   [address] [place] [address] [phone]       [facility] with [facility]. (As needed for emergencies)    Contact information:   [id number] [place] [address] [phone]      [facility] with [facility]. (As scheduled)    Specialty:  Gastroenterology   Contact information:   [address] [place] [id number] [phone]       Medication List         cefUROXime 250 MG tablet  Commonly known as:  CEFTIN  Take 1 tablet (250 mg total) by mouth 2 (two) times daily with a meal.     diphenoxylate-atropine 2.5-0.025 MG per tablet  Commonly known as:  LOMOTIL  Take 2 tablets by mouth 4 (four) times daily as needed for diarrhea or loose stools (1-2 tabs).     hydrocortisone 100 MG/60ML enema  Commonly known as:  CORTENEMA  Place 1 enema (100 mg total) rectally at bedtime.     OVER THE COUNTER MEDICATION  Take 3 tablets by mouth. Garden of Life Prenatal has iron and probiotic in it.     oxyCODONE-acetaminophen 5-325 MG per tablet  Commonly known as:  PERCOCET/ROXICET  Take 1-2  tablets by mouth every 6 (six) hours as needed.     predniSONE 10 MG tablet  Commonly known as:  STERAPRED UNI-PAK  Take 4 tablets (40 mg total) by mouth daily. Will taper as directed     ranitidine 300 MG tablet  Commonly known as:  ZANTAC  Take 300 mg by mouth daily as needed for heartburn.     TYLENOL PO  Take 2 tablets by mouth 3 (three) times daily as needed (cramping).        Sharen CounterLisa Leftwich-Kirby Certified Nurse-Midwife 03/19/2014 8:02 PM

## 2014-03-19 NOTE — MAU Note (Signed)
Hx of colitis.  Ongoing bloody diarrhea for 4 wks, last 2 wks have been worse.  Saw GI dr on Wed, planning to start a different med next wk. Called OB dr- told to come here.

## 2014-03-19 NOTE — Discharge Instructions (Signed)
Ulcerative Colitis  Ulcerative colitis is a long lasting swelling and soreness (inflammation) of the colon (large intestine). In patients with ulcerative colitis, sores (ulcers) and inflammation of the inner lining of the colon lead to illness. Ulcerative colitis can also cause problems outside the digestive tract.   Ulcerative colitis is closely related to another condition of inflammation of the intestines called Crohn's disease. Together, they are frequently referred to as inflammatory bowel disease (IBD). Ulcerative colitis and Crohn's diseases are conditions that can last years to decades. Men and women are affected equally. They most commonly begin during adolescence and early adulthood.  SYMPTOMS   Common symptoms of ulcerative colitis include rectal bleeding and diarrhea. There is a wide range of symptoms among patients with this disease depending on how severe the disease is. Some of these symptoms are:  · Abdominal pain or cramping.  · Diarrhea.  · Fever.  · Tiredness (fatigue).  · Weight loss.  · Night sweats.  · Rectal pain.  · Feeling the immediate need to have a bowel movement (rectal urgency).  CAUSES   Ulcerative colitis is caused by increased activity of the immune system in the intestines. The immune system is the system that protects the body against disease such as harmful bacteria, viruses, fungi, and other foreign invaders. When the immune system overacts, it causes inflammation. The cause of the increased immune system activity is not known. This over activity causes long-lasting inflammation and ulceration. This condition may be passed down from your parents (inherited). Brothers, sisters, children, and parents of patients with IBD are more likely to develop these diseases. It is not contagious. This means you cannot catch it from someone else.  DIAGNOSIS   Your caregiver may suspect ulcerative colitis based on your symptoms and exam. Blood tests may confirm that there is a problem. You may  be asked to submit a stool specimen for examination. X-rays and CT scans may be necessary. Ultimately, the diagnosis is usually made after a flexible tube is inserted via your anus and your colon is examined under sedation (colonoscopy). With this test, the specialist can take a tiny tissue sample from inside the bowel (biopsy). Examination of this biopsy tissue under a microscopy can reveal ulcerative colitis as the cause of your symptoms.  TREATMENT   · There is no cure for ulcerative colitis.  · Complications such as massive bleeding from the colon (hemorrhage), development of a hole in the colon (perforation), or the development of precancerous or cancerous changes of the colon may require surgery.  · Medications are often used to decrease inflammation and control the immune system. These include medicines related to aspirin, steroid medications, and newer and stronger medications to slow down the immune system. Some medications may be used as suppositories or enemas. A number of other medications are used or have been studied. Your caregiver will make specific recommendations.  HOME CARE INSTRUCTIONS   · There is no cure for ulcerative colitis disease. The best treatment is frequent checkups with your caregiver. Periodic reevaluation is important.  · Symptoms such as diarrhea can be controlled with medications. Avoid foods that have a laxative effect such fresh fruit and vegetables and dairy products. During flare ups, you can rest your bowel by staying away from solid foods. Drink clear liquids frequently during the day. Electrolyte or rehydrating fluids are best. Your caregiver can help you with suggestions. Drink often to prevent dehydration. When diarrhea has cleared, eat smaller meals and more often. Avoid food additives   and stimulants such as caffeine (coffee, tea, many sodas, or chocolate). Avoid dairy products. Enzyme supplements may help if you develop intolerance to a sugar in dairy products  (lactose). Ask your caregiver or dietitian about specific dietary instructions.  · If you had surgery, be sure you understand your care instructions thoroughly, including proper care of any surgical wounds.  · Take any medications exactly as prescribed.  · Try to maintain a positive attitude. Learn relaxation techniques such as self hypnosis, mental imaging, and muscle relaxation. If possible, avoid stresses that aggravate your condition. Exercise regularly. Follow your diet. Always get plenty of rest.  SEEK MEDICAL CARE IF:   · Your symptoms fail to improve after a week or two of new treatment.  · You experience continued weight loss.  · You have ongoing crampy digestion or loose bowels.  · You develop a new skin rash, skin sores, or eye problems.  SEEK IMMEDIATE MEDICAL CARE IF:   · You have worsening of your symptoms or develop new symptoms.  · You have an oral temperature above 102° F (38.9° C), not controlled by medicine.  · You develop bloody diarrhea.  · You have severe abdominal pain.  Document Released: 06/20/2005 Document Revised: 12/03/2011 Document Reviewed: 05/20/2007  Exit[employer]® Patient Information ©2015 [employer]. This information is not intended to replace advice given to you by your health care provider. Make sure you discuss any questions you have with your health care provider.

## 2014-03-20 ENCOUNTER — Encounter: Payer: Self-pay | Admitting: Nurse Practitioner

## 2014-03-22 ENCOUNTER — Telehealth: Payer: Self-pay | Admitting: Internal Medicine

## 2014-03-22 ENCOUNTER — Encounter: Payer: Self-pay | Admitting: Internal Medicine

## 2014-03-22 ENCOUNTER — Ambulatory Visit: Payer: BC Managed Care – PPO | Admitting: Physician Assistant

## 2014-03-22 NOTE — Telephone Encounter (Signed)
Spoke to Deanna Willis, she is requesting rx for Percocet , she still has a few but doesn't want to run out.  I told her I would call her tomorrow with an answer on that.  Also may I document that the nurse read the PPD at the hospital or do I need to repeat the test Sir.  Thank you.

## 2014-03-23 ENCOUNTER — Telehealth: Payer: Self-pay

## 2014-03-23 MED ORDER — OXYCODONE-ACETAMINOPHEN 5-325 MG PO TABS: 1.0000 | ORAL_TABLET | Freq: Four times a day (QID) | ORAL | Status: DC | PRN

## 2014-03-23 NOTE — Telephone Encounter (Signed)
Per Dr. Leone PayorGessner PPD was negative, [facility], also ok to refill pain med.

## 2014-03-23 NOTE — Telephone Encounter (Signed)
Prescription left up front for pt to pick up for pain med, pt aware. Humira information and orders faxed for pt. Waiting to hear back from [facility].

## 2014-03-23 NOTE — Telephone Encounter (Signed)
Message copied by Chrystie NoseHUNT, LINDA R on Tue Mar 23, 2014  2:41 PM ------      Message from: Iva BoopGESSNER, CARL E      Created: Tue Mar 23, 2014 11:07 AM      Regarding: RE: start Humira       Yes need to refill pain med would Rx #30 no refill      Go ahead and get maintenance ordered also      ----- Message -----         From: Lily LovingsLinda Ray Hunt, RN         Sent: 03/23/2014  10:46 AM           To: Iva Booparl E Gessner, MD      Subject: RE: start Humira                                         Dr. Leone PayorGessner I am working on this but the pt called yesterday and wanted to have her pain medication refilled. Are you ok refilling this? Also I am ordering the starter pack but do you want the maintenance dose ordered also?            Thanks,      Bonita QuinLinda      ----- Message -----         From: Iva Booparl E Gessner, MD         Sent: 03/23/2014   9:08 AM           To: Lily LovingsLinda Ray Hunt, RN      Subject: start Newman NipHumira                                             Saranya needs to start Humira            Please Rx the starter pack and instruct her, etc                  She is pregnant but not responding to steroids            Humira is category B            I was going to use Cimzia but it is not indicated in UC so please explain this to her             We need top record her PPD as negative - it was read by an RN at [facility] and she relayed this to me                  THANKS            I am at [phone] today if ?                         ------

## 2014-03-24 ENCOUNTER — Telehealth: Payer: Self-pay | Admitting: Internal Medicine

## 2014-03-24 NOTE — Telephone Encounter (Signed)
Form faxed again with ICD-9 Code and with Dr. Marvell FullerGessner's signature.

## 2014-03-25 ENCOUNTER — Other Ambulatory Visit (HOSPITAL_COMMUNITY): Payer: Self-pay | Admitting: Obstetrics and Gynecology

## 2014-03-25 DIAGNOSIS — O365935 Maternal care for other known or suspected poor fetal growth, third trimester, fetus 5: Secondary | ICD-10-CM

## 2014-03-26 ENCOUNTER — Inpatient Hospital Stay (HOSPITAL_COMMUNITY)
Admission: EM | Admit: 2014-03-26 | Discharge: 2014-04-14 | DRG: 781 | Disposition: A | Discharge status: Home / Self Care | Payer: BC Managed Care – PPO | Attending: Obstetrics & Gynecology | Admitting: Obstetrics & Gynecology

## 2014-03-26 ENCOUNTER — Encounter (HOSPITAL_COMMUNITY): Payer: Self-pay | Admitting: Emergency Medicine

## 2014-03-26 DIAGNOSIS — O0993 Supervision of high risk pregnancy, unspecified, third trimester: Secondary | ICD-10-CM

## 2014-03-26 DIAGNOSIS — K921 Melena: Secondary | ICD-10-CM

## 2014-03-26 DIAGNOSIS — O212 Late vomiting of pregnancy: Secondary | ICD-10-CM | POA: Diagnosis present

## 2014-03-26 DIAGNOSIS — K5190 Ulcerative colitis, unspecified, without complications: Secondary | ICD-10-CM

## 2014-03-26 DIAGNOSIS — E43 Unspecified severe protein-calorie malnutrition: Secondary | ICD-10-CM

## 2014-03-26 DIAGNOSIS — K51919 Ulcerative colitis, unspecified with unspecified complications: Secondary | ICD-10-CM

## 2014-03-26 DIAGNOSIS — O99891 Other specified diseases and conditions complicating pregnancy: Principal | ICD-10-CM | POA: Diagnosis present

## 2014-03-26 DIAGNOSIS — D509 Iron deficiency anemia, unspecified: Secondary | ICD-10-CM

## 2014-03-26 DIAGNOSIS — R110 Nausea: Secondary | ICD-10-CM

## 2014-03-26 DIAGNOSIS — K51911 Ulcerative colitis, unspecified with rectal bleeding: Secondary | ICD-10-CM

## 2014-03-26 DIAGNOSIS — O9989 Other specified diseases and conditions complicating pregnancy, childbirth and the puerperium: Principal | ICD-10-CM

## 2014-03-26 DIAGNOSIS — R634 Abnormal weight loss: Secondary | ICD-10-CM

## 2014-03-26 DIAGNOSIS — R197 Diarrhea, unspecified: Secondary | ICD-10-CM

## 2014-03-26 DIAGNOSIS — E871 Hypo-osmolality and hyponatremia: Secondary | ICD-10-CM | POA: Diagnosis present

## 2014-03-26 DIAGNOSIS — K602 Anal fissure, unspecified: Secondary | ICD-10-CM

## 2014-03-26 DIAGNOSIS — D693 Immune thrombocytopenic purpura: Secondary | ICD-10-CM | POA: Diagnosis present

## 2014-03-26 DIAGNOSIS — Z833 Family history of diabetes mellitus: Secondary | ICD-10-CM

## 2014-03-26 DIAGNOSIS — E860 Dehydration: Secondary | ICD-10-CM

## 2014-03-26 DIAGNOSIS — R109 Unspecified abdominal pain: Secondary | ICD-10-CM | POA: Diagnosis present

## 2014-03-26 DIAGNOSIS — R600 Localized edema: Secondary | ICD-10-CM | POA: Diagnosis not present

## 2014-03-26 DIAGNOSIS — K5150 Left sided colitis without complications: Secondary | ICD-10-CM

## 2014-03-26 DIAGNOSIS — D500 Iron deficiency anemia secondary to blood loss (chronic): Secondary | ICD-10-CM | POA: Diagnosis present

## 2014-03-26 DIAGNOSIS — Z3493 Encounter for supervision of normal pregnancy, unspecified, third trimester: Secondary | ICD-10-CM

## 2014-03-26 DIAGNOSIS — Z8249 Family history of ischemic heart disease and other diseases of the circulatory system: Secondary | ICD-10-CM

## 2014-03-26 DIAGNOSIS — Z862 Personal history of diseases of the blood and blood-forming organs and certain disorders involving the immune mechanism: Secondary | ICD-10-CM

## 2014-03-26 DIAGNOSIS — O99019 Anemia complicating pregnancy, unspecified trimester: Secondary | ICD-10-CM | POA: Diagnosis present

## 2014-03-26 LAB — COMPREHENSIVE METABOLIC PANEL
ALT: 11 U/L (ref 0–35)
AST: 13 U/L (ref 0–37)
Albumin: 1.7 g/dL — ABNORMAL LOW (ref 3.5–5.2)
Alkaline Phosphatase: 126 U/L — ABNORMAL HIGH (ref 39–117)
Anion gap: 13 (ref 5–15)
BUN: 5 mg/dL — ABNORMAL LOW (ref 6–23)
CHLORIDE: 98 meq/L (ref 96–112)
CO2: 22 mEq/L (ref 19–32)
Calcium: 8.5 mg/dL (ref 8.4–10.5)
Creatinine, Ser: 0.49 mg/dL — ABNORMAL LOW (ref 0.50–1.10)
GFR calc Af Amer: >90 mL/min (ref >90)
Glucose, Bld: 102 mg/dL — ABNORMAL HIGH (ref 70–99)
POTASSIUM: 3.9 meq/L (ref 3.7–5.3)
Sodium: 133 mEq/L — ABNORMAL LOW (ref 137–147)
Total Bilirubin: <0.2 mg/dL — ABNORMAL LOW (ref 0.3–1.2)
Total Protein: 6.2 g/dL (ref 6.0–8.3)

## 2014-03-26 LAB — DIFFERENTIAL
Basophils Absolute: 0 10*3/uL (ref 0.0–0.1)
Basophils Relative: 0 % (ref 0–1)
EOS ABS: 0.2 10*3/uL (ref 0.0–0.7)
EOS PCT: 1 % (ref 0–5)
LYMPHS ABS: 2.1 10*3/uL (ref 0.7–4.0)
Lymphocytes Relative: 12 % (ref 12–46)
Monocytes Absolute: 1.4 10*3/uL — ABNORMAL HIGH (ref 0.1–1.0)
Monocytes Relative: 7 % (ref 3–12)
NEUTROS ABS: 14.5 10*3/uL — AB (ref 1.7–7.7)
Neutrophils Relative %: 80 % — ABNORMAL HIGH (ref 43–77)

## 2014-03-26 LAB — CBC
HCT: 36 % (ref 36.0–46.0)
Hemoglobin: 12 g/dL (ref 12.0–15.0)
MCH: 26.8 pg (ref 26.0–34.0)
MCHC: 33.3 g/dL (ref 30.0–36.0)
MCV: 80.5 fL (ref 78.0–100.0)
PLATELETS: 435 10*3/uL — AB (ref 150–400)
RBC: 4.47 MIL/uL (ref 3.87–5.11)
RDW: 14.9 % (ref 11.5–15.5)
WBC: 18.3 10*3/uL — AB (ref 4.0–10.5)

## 2014-03-26 LAB — I-STAT CHEM 8, ED
CHLORIDE: 100 meq/L (ref 96–112)
Calcium, Ion: 1.11 mmol/L — ABNORMAL LOW (ref 1.12–1.23)
Creatinine, Ser: 0.5 mg/dL (ref 0.50–1.10)
Glucose, Bld: 103 mg/dL — ABNORMAL HIGH (ref 70–99)
HCT: 39 % (ref 36.0–46.0)
Hemoglobin: 13.3 g/dL (ref 12.0–15.0)
Potassium: 4.6 mEq/L (ref 3.7–5.3)
SODIUM: 133 meq/L — AB (ref 137–147)
TCO2: 25 mmol/L (ref 0–100)

## 2014-03-26 LAB — ABO/RH: ABO/RH(D): A POS

## 2014-03-26 LAB — TYPE AND SCREEN
ABO/RH(D): A POS
Antibody Screen: NEGATIVE

## 2014-03-26 LAB — PROTIME-INR
INR: 1.06 (ref 0.00–1.49)
Prothrombin Time: 13.8 seconds (ref 11.6–15.2)

## 2014-03-26 LAB — I-STAT CG4 LACTIC ACID, ED: LACTIC ACID, VENOUS: 1.28 mmol/L (ref 0.5–2.2)

## 2014-03-26 MED ORDER — MORPHINE SULFATE 4 MG/ML IJ SOLN
8.0000 mg | Freq: Once | INTRAMUSCULAR | Status: AC
  Administered 2014-03-26: 8 mg via INTRAVENOUS
  Filled 2014-03-26: qty 2

## 2014-03-26 MED ORDER — SODIUM CHLORIDE 0.9 % IV BOLUS (SEPSIS)
1000.0000 mL | Freq: Once | INTRAVENOUS | Status: AC
  Administered 2014-03-26: 1000 mL via INTRAVENOUS

## 2014-03-26 MED ORDER — ONDANSETRON HCL 4 MG/2ML IJ SOLN
4.0000 mg | Freq: Once | INTRAMUSCULAR | Status: AC
  Administered 2014-03-26: 4 mg via INTRAVENOUS
  Filled 2014-03-26: qty 2

## 2014-03-26 NOTE — ED Provider Notes (Signed)
28 year old female who is pregnant at [redacted] weeks 3 days and has ulcerative colitis which has not been responding to treatment. She is currently on prednisone 40 mg a day continues to have abdominal cramping and bloody diarrhea. She has lost over 15 pounds. She continues to feel normal fetal movement. On exam, she appears somewhat pale. Lungs are clear and heart has regular rate and rhythm. Abdomen is gravid uterus with size consistent with dates, and is nontender. She clearly has poorly-controlled ulcerative colitis. We'll need to discuss with gastroenterology and OB/GYN regarding appropriate management at this point.  Consultation was held with OB/GYN who felt that the patient was not [facility]. Gastroenterology was willing to see the patient in consult and I feel they should admit the patient primarily. Case is discussed with [facility] who agreed to admit the patient with consultations from OB/GYN and gastroenterology.  I saw and evaluated the patient, reviewed the resident's note and I agree with the findings and plan.    Dione Boozeavid Clearnce Leja, MD 03/27/14 [phone]

## 2014-03-26 NOTE — Progress Notes (Signed)
Pt here with complaints related to colitis. NST reactive and reassuring, OB cleared by Dr Tenny Crawoss. Pt w/o OB complaints. Questions answers, no concerns r.t pregnancy at this time

## 2014-03-26 NOTE — ED Notes (Addendum)
Rapid Response OB RN will be here once EDP evaluates pt. Told OB rn that pts. HR > 143 and pts. Baby is moving.

## 2014-03-26 NOTE — ED Notes (Addendum)
X 2 weeks of severe blood diarrhea, but going on for a month. Hx. Ulcer colitis. Weakness. [facility]. Was told they could not help her. Pt. Is pale and weak. Hr. > 143. Rapid Response OB called. Pt. Is [redacted] weeks pregnant. Baby is moving.

## 2014-03-27 DIAGNOSIS — K921 Melena: Secondary | ICD-10-CM

## 2014-03-27 DIAGNOSIS — R634 Abnormal weight loss: Secondary | ICD-10-CM

## 2014-03-27 DIAGNOSIS — K5190 Ulcerative colitis, unspecified, without complications: Secondary | ICD-10-CM

## 2014-03-27 DIAGNOSIS — R197 Diarrhea, unspecified: Secondary | ICD-10-CM

## 2014-03-27 DIAGNOSIS — E860 Dehydration: Secondary | ICD-10-CM

## 2014-03-27 LAB — CBC WITH DIFFERENTIAL/PLATELET
BASOS PCT: 0 % (ref 0–1)
Basophils Absolute: 0 10*3/uL (ref 0.0–0.1)
EOS ABS: 0.1 10*3/uL (ref 0.0–0.7)
Eosinophils Relative: 1 % (ref 0–5)
HEMATOCRIT: 31.4 % — AB (ref 36.0–46.0)
Hemoglobin: 10.4 g/dL — ABNORMAL LOW (ref 12.0–15.0)
Lymphocytes Relative: 9 % — ABNORMAL LOW (ref 12–46)
Lymphs Abs: 1.2 10*3/uL (ref 0.7–4.0)
MCH: 27.3 pg (ref 26.0–34.0)
MCHC: 33.1 g/dL (ref 30.0–36.0)
MCV: 82.4 fL (ref 78.0–100.0)
MONO ABS: 0.5 10*3/uL (ref 0.1–1.0)
Monocytes Relative: 4 % (ref 3–12)
NEUTROS PCT: 87 % — AB (ref 43–77)
Neutro Abs: 11.3 10*3/uL — ABNORMAL HIGH (ref 1.7–7.7)
Platelets: 365 10*3/uL (ref 150–400)
RBC: 3.81 MIL/uL — ABNORMAL LOW (ref 3.87–5.11)
RDW: 15.1 % (ref 11.5–15.5)
WBC: 13 10*3/uL — ABNORMAL HIGH (ref 4.0–10.5)

## 2014-03-27 LAB — COMPREHENSIVE METABOLIC PANEL
ALBUMIN: 1.3 g/dL — AB (ref 3.5–5.2)
ALT: 9 U/L (ref 0–35)
ANION GAP: 12 (ref 5–15)
AST: 9 U/L (ref 0–37)
Alkaline Phosphatase: 108 U/L (ref 39–117)
BUN: 5 mg/dL — AB (ref 6–23)
CO2: 22 mEq/L (ref 19–32)
Calcium: 8 mg/dL — ABNORMAL LOW (ref 8.4–10.5)
Chloride: 101 mEq/L (ref 96–112)
Creatinine, Ser: 0.52 mg/dL (ref 0.50–1.10)
GFR calc Af Amer: >90 mL/min (ref >90)
GFR calc non Af Amer: >90 mL/min (ref >90)
Glucose, Bld: 108 mg/dL — ABNORMAL HIGH (ref 70–99)
Potassium: 4.4 mEq/L (ref 3.7–5.3)
Sodium: 135 mEq/L — ABNORMAL LOW (ref 137–147)
TOTAL PROTEIN: 5 g/dL — AB (ref 6.0–8.3)
Total Bilirubin: <0.2 mg/dL — ABNORMAL LOW (ref 0.3–1.2)

## 2014-03-27 LAB — CLOSTRIDIUM DIFFICILE BY PCR: Toxigenic C. Difficile by PCR: NEGATIVE

## 2014-03-27 LAB — C-REACTIVE PROTEIN: CRP: 18.4 mg/dL — AB (ref <0.60)

## 2014-03-27 LAB — SEDIMENTATION RATE: SED RATE: 77 mm/h — AB (ref 0–22)

## 2014-03-27 MED ORDER — MESALAMINE 400 MG PO CPDR
2400.0000 mg | DELAYED_RELEASE_CAPSULE | Freq: Two times a day (BID) | ORAL | Status: DC
  Administered 2014-03-27 – 2014-03-28 (×4): 2400 mg via ORAL
  Filled 2014-03-27 (×6): qty 6

## 2014-03-27 MED ORDER — BOOST / RESOURCE BREEZE PO LIQD
1.0000 | Freq: Three times a day (TID) | ORAL | Status: DC
  Administered 2014-03-27: 1 via ORAL

## 2014-03-27 MED ORDER — COMPLETENATE 29-1 MG PO CHEW: 1.0000 | CHEWABLE_TABLET | Freq: Every day | ORAL | Status: DC

## 2014-03-27 MED ORDER — SODIUM CHLORIDE 0.9 % IJ SOLN
3.0000 mL | Freq: Two times a day (BID) | INTRAMUSCULAR | Status: DC
  Administered 2014-03-27 – 2014-04-14 (×24): 3 mL via INTRAVENOUS

## 2014-03-27 MED ORDER — METHYLPREDNISOLONE SODIUM SUCC 40 MG IJ SOLR
40.0000 mg | Freq: Every day | INTRAMUSCULAR | Status: DC
  Administered 2014-03-27: 40 mg via INTRAVENOUS
  Filled 2014-03-27: qty 1

## 2014-03-27 MED ORDER — ONDANSETRON HCL 4 MG PO TABS
4.0000 mg | ORAL_TABLET | Freq: Four times a day (QID) | ORAL | Status: DC | PRN
  Filled 2014-03-27: qty 1

## 2014-03-27 MED ORDER — MESALAMINE 4 G RE ENEM
4.0000 g | ENEMA | Freq: Two times a day (BID) | RECTAL | Status: DC
  Administered 2014-03-27 – 2014-03-28 (×4): 4 g via RECTAL
  Filled 2014-03-27 (×6): qty 60

## 2014-03-27 MED ORDER — OXYCODONE HCL 5 MG PO TABS
5.0000 mg | ORAL_TABLET | ORAL | Status: DC | PRN
  Administered 2014-03-27 – 2014-04-14 (×74): 5 mg via ORAL
  Filled 2014-03-27 (×76): qty 1

## 2014-03-27 MED ORDER — ACETAMINOPHEN 500 MG PO TABS: 1000.0000 mg | ORAL_TABLET | Freq: Every day | ORAL | Status: DC | PRN

## 2014-03-27 MED ORDER — MESALAMINE 400 MG PO TBEC: 2400.0000 mg | DELAYED_RELEASE_TABLET | Freq: Two times a day (BID) | ORAL | Status: DC

## 2014-03-27 MED ORDER — MESALAMINE 400 MG PO TBEC: 400.0000 mg | DELAYED_RELEASE_TABLET | Freq: Two times a day (BID) | ORAL | Status: DC

## 2014-03-27 MED ORDER — OXYCODONE-ACETAMINOPHEN 5-325 MG PO TABS: 1.0000 | ORAL_TABLET | Freq: Four times a day (QID) | ORAL | Status: DC | PRN

## 2014-03-27 MED ORDER — METHYLPREDNISOLONE SODIUM SUCC 40 MG IJ SOLR
40.0000 mg | Freq: Two times a day (BID) | INTRAMUSCULAR | Status: DC
  Administered 2014-03-27 – 2014-04-02 (×12): 40 mg via INTRAVENOUS
  Filled 2014-03-27 (×14): qty 1

## 2014-03-27 MED ORDER — ONDANSETRON HCL 4 MG/2ML IJ SOLN
4.0000 mg | Freq: Four times a day (QID) | INTRAMUSCULAR | Status: DC | PRN
  Administered 2014-03-27 – 2014-03-28 (×2): 4 mg via INTRAVENOUS
  Filled 2014-03-27 (×2): qty 2

## 2014-03-27 MED ORDER — MORPHINE SULFATE 2 MG/ML IJ SOLN
2.0000 mg | INTRAMUSCULAR | Status: DC | PRN
  Administered 2014-03-27 – 2014-03-28 (×8): 2 mg via INTRAVENOUS
  Filled 2014-03-27 (×9): qty 1

## 2014-03-27 MED ORDER — MESALAMINE 400 MG PO CPDR
400.0000 mg | DELAYED_RELEASE_CAPSULE | Freq: Two times a day (BID) | ORAL | Status: DC
  Filled 2014-03-27 (×2): qty 1

## 2014-03-27 MED ORDER — SODIUM CHLORIDE 0.9 % IV SOLN
INTRAVENOUS | Status: DC
  Administered 2014-03-27 – 2014-03-29 (×4): via INTRAVENOUS

## 2014-03-27 MED ORDER — PRENATAL MULTIVITAMIN CH
1.0000 | ORAL_TABLET | Freq: Every day | ORAL | Status: DC
  Administered 2014-03-27 – 2014-04-14 (×17): 1 via ORAL
  Filled 2014-03-27 (×17): qty 1

## 2014-03-27 NOTE — H&P (Signed)
[facility] History and Physical  Patient: Deanna Willis  YWV:[id number]  DOB: 08-23-1986  DOS: the patient was seen and examined on 03/27/2014 PCP: Daria Pastures, MD  Chief Complaint: Diarrhea  HPI: Deanna Willis is a 28 y.o. female with Past medical history of ITP, ulcerative colitis, 29 week 3 day pregnant . Patient presents with complaints of diarrhea and abdominal pain. She has a history of ulcerative colitis and since last 4 weeks has been having a flareup with increased frequency of bowel movements and occasional bleeding. She also had some abdominal pain. She has been seen by outpatient OB/GYN and gastroenterology for the same complaint. She was started on prednisone 4 weeks ago 40 mg daily. She also has been admitted in MAU for IV hydration. She was also given corticosteroids enema without much improvement. Initial plan was to start her on Humira, insurance and prescription had been fired at present, PPD is negative. During her recent visit in the ED she was started on Ceftin for UTI which she is due to complete today. She mentions that yesterday she had more than 12 bowel movements with significant irritation and abdominal pain. Her abdominal pain is a crampy in nature and located all over her abdomen. She denies any fever or chills denies any cough. Complains of nausea but no vomiting. Complains of occasional heartburn. No chest pain or shortness of breath. No dizziness no lightheadedness. She does not feel any contractions but her baby has been moving she feels that quickening. She mentions she has been taking prenatal vitamins and herbal supplement since she is pregnant. Other than being on prednisone, being off of canasa and being on Ceftin there are no recent change in her medications. She has lost nearly 17 pounds in the last 2 weeks.  The patient is coming from home. And at her baseline independent for most of her ADL.  Review of Systems: as mentioned in the history  of present illness.  A Comprehensive review of the other systems is negative.  Past Medical History  Diagnosis Date  . ITP (idiopathic thrombocytopenic purpura)     Question of associated tendency towards von Willebrand  . Menorrhagia     Associated with thrombocytopenia, 2 episodes in teenage years responded the platelet infusion  . 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 Tonsillectomy    . Colonoscopy w/ biopsies  03/24/2013   Social History:  reports that she has never smoked. She has never used smokeless tobacco. She reports that she drinks alcohol. She reports that she does not use illicit drugs.  Allergies  Allergen Reactions  . Mesalamine Er Other (See Comments)    Tablet form worsens colitis symptoms  . Phenergan [Promethazine Hcl] Other (See Comments)    Fainting, syncope    Family History  Problem Relation Age of Onset  . Diabetes Father   . Diabetes Paternal Grandfather   . Heart disease Paternal Grandfather   . Diabetes Maternal Grandmother   . Heart disease Maternal Grandmother   . Irritable bowel syndrome Paternal Grandmother   . Colon cancer Neg Hx   . Crohn's disease Neg Hx   . Kidney disease Neg Hx   . Liver disease Neg Hx     Prior to Admission medications   Medication Sig Start Date End Date Taking? Authorizing Provider  acetaminophen (TYLENOL) 500 MG tablet Take 1,000 mg by mouth daily as needed (pain).   Yes Historical  Provider, MD  cefUROXime (CEFTIN) 250 MG tablet Take 250 mg by mouth 2 (two) times daily with a meal. 7 day course filled 03/19/14   Yes Historical Provider, MD  OVER THE COUNTER MEDICATION Take 3 tablets by mouth at bedtime. [employer] [facility] [employer] Prenatal has iron and probiotic in it.   Yes Historical Provider, MD  oxyCODONE-acetaminophen (PERCOCET/ROXICET) 5-325 MG per tablet Take 1-2 tablets by mouth every 6 (six) hours as needed (pain).    Yes  Historical Provider, MD  predniSONE (DELTASONE) 10 MG tablet Take 40 mg by mouth at bedtime. For inflammation from ulcerative colitis   Yes Historical Provider, MD  Probiotic Product (PROBIOTIC PO) Take 1 tablet by mouth daily.   Yes Historical Provider, MD  ranitidine (ZANTAC) 300 MG tablet Take 300 mg by mouth daily as needed for heartburn.   Yes Historical Provider, MD    Physical Exam: Filed Vitals:   03/26/14 2255 03/26/14 2300 03/26/14 2330 03/27/14 0000  BP: 130/82 119/76 119/82 121/74  Pulse: 98 101 96 101  Temp:      TempSrc:      Resp: 18 24 20 22   SpO2: 100% 97% 96% 97%    General: Alert, Awake and Oriented to Time, Place and Person. Appear in mild distress Eyes: PERRL ENT: Oral Mucosa clear moist. Neck:  no  JVD Cardiovascular: S1 and S2 Present, no  Murmur, Peripheral Pulses Present Respiratory: Bilateral Air entry equal and Decreased, Clear to Auscultation,   no  Crackles,No  wheezes Abdomen: Bowel Sound Present, Soft and   diffuse mild tender Skin: no  Rash Extremities: No  Pedal edema,  no calf tenderness Neurologic: Grossly no focal neuro deficit.  Labs on Admission:  CBC:  Recent Labs Lab 03/26/14 2030 03/26/14 2036 03/26/14 2040  WBC  --  18.3*  --   NEUTROABS 14.5*  --   --   HGB  --  12.0 13.3  HCT  --  36.0 39.0  MCV  --  80.5  --   PLT  --  435*  --     CMP     Component Value Date/Time   NA 133* 03/26/2014 2040   K 4.6 03/26/2014 2040   CL 100 03/26/2014 2040   CO2 22 03/26/2014 2036   GLUCOSE 103* 03/26/2014 2040   BUN <3* 03/26/2014 2040   CREATININE 0.50 03/26/2014 2040   CALCIUM 8.5 03/26/2014 2036   PROT 6.2 03/26/2014 2036   ALBUMIN 1.7* 03/26/2014 2036   AST 13 03/26/2014 2036   ALT 11 03/26/2014 2036   ALKPHOS 126* 03/26/2014 2036   BILITOT <0.2* 03/26/2014 2036   GFRNONAA >90 03/26/2014 2036   GFRAA >90 03/26/2014 2036   Assessment/Plan Principal Problem:   Ulcerative colitis, acute Active Problems:   Anemia, iron deficiency   Pregnant and not  yet delivered in third trimester   1. Ulcerative colitis, acute  patient is presenting with complaints of abdominal pain and diarrhea. She has increased frequency of diarrhea over last 2 days. With that she also has significant weight loss. Initially OB/GYN was consulted, OB/GYN rapid response nurse evaluated the patient and the baby and recommended there are no acute contractions and an ST is reassuring.Dr. Harrington Challenger on call recommended patient to be admitted to medical site as patient does not appear to have any ongoing obstetrics complaint. Initially patient was tachycardic. She has leukocytosis. So far to bowel movements here in the hospital. We would admit her to the hospital give her IV hydration.  I will give her IV Solu-Medrol 40 mg daily. IV Zofran for nausea and on to ask for pain medication. Patient recommended to avoid using multiple opioids due to withdrawal and respiratory suppression to child in future. Check ESR and CRP. Gastroenterology consulted who recommended continue current management. Holding off on empiric antibiotics at present check C. difficile and gastro-pathogen  2. 29 weeks pregnancy OB/GYN consulted who will be following the patient on a daily NST. They will be following the patient. OB/GYN rapid response nurse [phone].  3. anemia  Continue prenatal vitamins   Consults:  Gastroenterology, OB/GYN   DVT Prophylaxis: mechanical compression device Nutrition:  liquid diet   Code Status:  full  Family Communication:  husband  was present at bedside, opportunity was given to ask question and all questions were answered satisfactorily at the time of interview. Disposition: Admitted to inpatient in telemetry unit.  Author: Berle Mull, MD [facility] Pager: [phone] 03/27/2014, 1:52 AM    If 7PM-7AM, please contact night-coverage [email or web address] Password [email or web address]  **Disclaimer: This note may have been dictated with voice recognition software. Similar sounding  words can inadvertently be transcribed and this note may contain transcription errors which may not have been corrected upon publication of note.**

## 2014-03-27 NOTE — ED Notes (Signed)
Pt. Took own percocet 5/325 mg percocet per dr. When he was in room.

## 2014-03-27 NOTE — Progress Notes (Signed)
Patient removed telemetry monitor and refuses to allow it to be replaced. Patient states "heart is fine". Will continue to monitor.

## 2014-03-27 NOTE — ED Notes (Signed)
Consulting MD at bedside

## 2014-03-27 NOTE — Progress Notes (Signed)
[facility] Progress Note   Ammi A. Hyacinth Willis [id number] DOB: 04-Nov-1985 DOA: 03/26/2014 PCP: Loney LaurenceHORVATH,MICHELLE A, MD  Brief narrative: Deanna Willis is a 28 y.o. female who is [redacted] wks pregnant presents with an ulcerative colitis flare starting about 1 mo ago. She is having profuse diarrhea and has been on Prednisone 40 mg daily without improvement. Dr Leone PayorGessner was considering starting her on Humira. She has been started on Solu-medrol 40 ming IV daily on admission.  OB, Dr Tenny Crawoss is on call for Dr Henderson CloudHorvath who is the patient's OB. Dr [facility] There is an OB rapid response nurse who is coming daily to perform a NST.    Subjective: Stool less frequent and less bloody.   Assessment/Plan: Principal Problem:   Ulcerative colitis, acute - management per GI - started on Asacol, Rowasa enemas BID and full liquids- increase Solumedrol to 40 BID - pt intolerant of dairy products- will change back to clears  Active Problems:    Pregnant and not yet delivered in third trimester - per OB service  Anemia - follow    Code Status: Full code Family Communication: none Disposition Plan: home when stable  Consultants: GI  Procedures: none  Antibiotics: Anti-infectives   None       DVT prophylaxis: SCDs  Objective: Filed Weights   03/27/14 0225  Weight: 92.216 kg (203 lb 4.8 oz)    Vitals Filed Vitals:   03/27/14 0130 03/27/14 0225 03/27/14 0527 03/27/14 1421  BP: 124/80 114/73 113/72 119/76  Pulse: 94 85 70 90  Temp:  98.3 F (36.8 C) 98.5 F (36.9 C) 98.1 F (36.7 C)  TempSrc:  Oral Oral Oral  Resp: 20 20 18 18   Height:  6\' 1"  (1.854 m)    Weight:  92.216 kg (203 lb 4.8 oz)    SpO2: 97% 98% 96% 97%      Intake/Output Summary (Last 24 hours) at 03/27/14 1618 Last data filed at 03/27/14 0615  Gross per 24 hour  Intake   2490 ml  Output      0 ml  Net   2490 ml     Exam: General: No acute respiratory  distress Lungs: Clear to auscultation bilaterally without wheezes or crackles Cardiovascular: Regular rate and rhythm without murmur gallop or rub normal S1 and S2 Abdomen: diffuse tenderness, nondistended, soft, bowel sounds positive, no rebound, no ascites, no appreciable mass Extremities: No significant cyanosis, clubbing, or edema bilateral lower extremities  Data Reviewed: Basic Metabolic Panel:  Recent Labs Lab 03/26/14 2036 03/26/14 2040 03/27/14 0426  NA 133* 133* 135*  K 3.9 4.6 4.4  CL 98 100 101  CO2 22  --  22  GLUCOSE 102* 103* 108*  BUN 5* <3* 5*  CREATININE 0.49* 0.50 0.52  CALCIUM 8.5  --  8.0*   Liver Function Tests:  Recent Labs Lab 03/26/14 2036 03/27/14 0426  AST 13 9  ALT 11 9  ALKPHOS 126* 108  BILITOT <0.2* <0.2*  PROT 6.2 5.0*  ALBUMIN 1.7* 1.3*   No results found for this basename: LIPASE, AMYLASE,  in the last 168 hours No results found for this basename: AMMONIA,  in the last 168 hours CBC:  Recent Labs Lab 03/26/14 2030 03/26/14 2036 03/26/14 2040 03/27/14 0426  WBC  --  18.3*  --  13.0*  NEUTROABS 14.5*  --   --  11.3*  HGB  --  12.0 13.3 10.4*  HCT  --  36.0 39.0 31.4*  MCV  --  80.5  --  82.4  PLT  --  435*  --  365   Cardiac Enzymes: No results found for this basename: CKTOTAL, CKMB, CKMBINDEX, TROPONINI,  in the last 168 hours BNP (last 3 results) No results found for this basename: PROBNP,  in the last 8760 hours CBG: No results found for this basename: GLUCAP,  in the last 168 hours  Recent Results (from the past 240 hour(s))  CLOSTRIDIUM DIFFICILE BY PCR     Status: None   Collection Time    03/27/14  2:58 AM      Result Value Ref Range Status   C difficile by pcr NEGATIVE  NEGATIVE Final     Studies:  Recent x-ray studies have been reviewed in detail by the Attending Physician  Scheduled Meds:  Scheduled Meds: . Mesalamine  2,400 mg Oral BID  . mesalamine  4 g Rectal BID  . methylPREDNISolone  (SOLU-MEDROL) injection  40 mg Intravenous Q12H  . sodium chloride  3 mL Intravenous Q12H   Continuous Infusions: . sodium chloride 100 mL/hr at 03/27/14 0121    Time spent on care of this patient: 35 min   Theador Jezewski, MD 03/27/2014, 4:18 PM  LOS: 1 day   [facility] Office  [phone] Pager - Text Page per Loretha StaplerAmion   If 7PM-7AM, please contact night-coverage [email or web address]

## 2014-03-27 NOTE — Consult Note (Signed)
[facility] Consult: 9:29 AM 03/27/2014  LOS: 1 day    Referring Provider: Maia Petties MD  Primary Care Physician:  Loney Laurence, MD Primary Gastroenterologist:  Dr. Leone Payor     Reason for Consultation:  Colitis with bloody diarrhea   HPI: Deanna Willis is a 28 y.o. female.   Hx ITP, tendency towards Von Willebrand's.  Platelet infusion for menorrhagia as teenager.  Diagnosed with left sided UC July 2014 after 3 or more years of bloody diarrheal issues.  Starte on Lialda dose eventully upped to 4 daily.  Anal fissure treated with NTG ointment in 05/2013. In Oct 2014 was also on prednisone taper for bloody diarrhea flare.  Stopped Prednison on her own as was doing well but restarted 10 mg along with Canasa PR in 09/2013 per Dr Leone Payor.  However she stopped these as caused too much pain in rectum. Biologic Cimzia being considered as category B for pregnancy.  Had negative TB skin test one week ago.  She was screened for hepatitis B and antigen was negative on 11/26/2013.   4-6 weeks ago started on 40 mg Prednisone for flare.  Having bloody diarrhea and nausea, po intolerance. Had stopped the Canasa as these made rectum hurt.  Started on cortenemas a week ago, not helping.  Sxs did not improve with addition of Prednisone.  sxs accelerated to >20 bloody BMs daily.  No emesis.  + crampy pain.  Weight down 12 or more # from max of 219 due to not taking much po.   C diff negative 6/22 and today.  stool pathogen panel in process.  Hgb 9.1 in 09/2013, 12.2 in 6/26, 12 yesterday, 10.4 today.  MCV normal.  WBCs to 18.3 yesterday, 13 today. Were 16.7 on 6/26.  Albumin low at 1.3     Past Medical History  Diagnosis Date  . ITP (idiopathic thrombocytopenic purpura)     Question of associated tendency towards von  Willebrand  . Menorrhagia     Associated with thrombocytopenia, 2 episodes in teenage years responded the platelet infusion  . 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 Tonsillectomy    . Colonoscopy w/ biopsies  03/24/2013    Prior to Admission medications   Medication Sig Start Date End Date Taking? Authorizing Provider  acetaminophen (TYLENOL) 500 MG tablet Take 1,000 mg by mouth daily as needed (pain).   Yes Historical Provider, MD  cefUROXime (CEFTIN) 250 MG tablet Take 250 mg by mouth 2 (two) times daily with a meal. 7 day course filled 03/19/14   Yes Historical Provider, MD  OVER THE COUNTER MEDICATION Take 3 tablets by mouth at bedtime. Garden of Life Prenatal has iron and probiotic in it.   Yes Historical Provider, MD  oxyCODONE-acetaminophen (PERCOCET/ROXICET) 5-325 MG per tablet Take 1-2 tablets by mouth every 6 (six) hours as needed (pain).    Yes Historical Provider, MD  predniSONE (DELTASONE) 10 MG tablet Take 40 mg by mouth at bedtime. For inflammation from  ulcerative colitis   Yes Historical Provider, MD  Probiotic Product (PROBIOTIC PO) Take 1 tablet by mouth daily.   Yes Historical Provider, MD  ranitidine (ZANTAC) 300 MG tablet Take 300 mg by mouth daily as needed for heartburn.   Yes Historical Provider, MD    Scheduled Meds: . methylPREDNISolone (SOLU-MEDROL) injection  40 mg Intravenous Daily  . sodium chloride  3 mL Intravenous Q12H   Infusions: . sodium chloride 100 mL/hr at 03/27/14 0121   PRN Meds: acetaminophen, morphine injection, ondansetron (ZOFRAN) IV, ondansetron, oxyCODONE, oxyCODONE-acetaminophen   Allergies as of 03/26/2014 - Review Complete 03/26/2014  Allergen Reaction Noted  . Mesalamine er Other (See Comments) 03/01/2014  . Phenergan [promethazine hcl] Other (See Comments) 03/11/2013    Family History  Problem Relation Age of Onset  . Diabetes  Father   . Diabetes Paternal Grandfather   . Heart disease Paternal Grandfather   . Diabetes Maternal Grandmother   . Heart disease Maternal Grandmother   . Irritable bowel syndrome Paternal Grandmother   . Colon cancer Neg Hx   . Crohn's disease Neg Hx   . Kidney disease Neg Hx   . Liver disease Neg Hx     History   Social History  . Marital Status: Married    Spouse Name: N/A    Number of Children: N/A  . Years of Education: N/A   Occupational History  . Hairstylist    Social History Main Topics  . Smoking status: Never Smoker   . Smokeless tobacco: Never Used  . Alcohol Use: Yes     Comment: OCC.; none while pregnant  . Drug Use: No  . Sexual Activity: Yes   Other Topics Concern  . 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  No falls.  Some weakness, and fatigue ENT:  No nose bleeds Pulm:  No cough or SOB CV:  No palpitations, no LE edema.  GU:  No hematuria, no frequency GI:  Per HPI Heme:  Per HPI   Transfusions:  none Neuro:  No headaches, no peripheral tingling or numbness Derm:  No itching, no rash or sores.  Endocrine:  No sweats or chills.  No polyuria or dysuria Immunization:  Per HPI Travel:  None beyond local counties in last few months.    PHYSICAL EXAM: Vital signs in last 24 hours: Filed Vitals:   03/27/14 0527  BP: 113/72  Pulse: 70  Temp: 98.5 F (36.9 C)  Resp: 18   Wt Readings from Last 3 Encounters:  03/27/14 92.216 kg (203 lb 4.8 oz)  03/19/14 93.895 kg (207 lb)  03/17/14 93.668 kg (206 lb 8 oz)    General: somewhat pale but not acutely unwell  Appearing WF Head:  No swelling or asymmetry  Eyes:  No icterus Ears:  Not HOH  Nose:  No congestion or discharge Mouth:  Clear, moist.  Good teeth Neck:  No mass, no JVD Lungs:  No dyspnea or cough.  Lungs clear Heart: RRR Abdomen:  Soft, pregnant, fundal height nearly to epigastrium.  Tender  diffusely.  Active BS.  Exam limited by fetal monitoring test apparatus. Rectal: not performed due to fetal testing in progress   Musc/Skeltl: no joint swelling or deformity Extremities:  No CCE  Neurologic:  Oriented x 3.  No tremor, no gross deficits Skin:  No telangectasia, no sores, no rash Tattoos:  On right arm  Nodes:  No cervical adenopathy.    Psych:  Pleasant, relaxed.   Intake/Output from previous day: 07/03 0701 - 07/04 0700 In: 2490 [I.V.:2490] Out: -  Intake/Output this shift:    LAB RESULTS:  Recent Labs  03/26/14 2036 03/26/14 2040 03/27/14 0426  WBC 18.3*  --  13.0*  HGB 12.0 13.3 10.4*  HCT 36.0 39.0 31.4*  PLT 435*  --  365   BMET Lab Results  Component Value Date   NA 135* 03/27/2014   NA 133* 03/26/2014   NA 133* 03/26/2014   K 4.4 03/27/2014   K 4.6 03/26/2014   K 3.9 03/26/2014   CL 101 03/27/2014   CL 100 03/26/2014   CL 98 03/26/2014   CO2 22 03/27/2014   CO2 22 03/26/2014   CO2 25 03/19/2014   GLUCOSE 108* 03/27/2014   GLUCOSE 103* 03/26/2014   GLUCOSE 102* 03/26/2014   BUN 5* 03/27/2014   BUN <3* 03/26/2014   BUN 5* 03/26/2014   CREATININE 0.52 03/27/2014   CREATININE 0.50 03/26/2014   CREATININE 0.49* 03/26/2014   CALCIUM 8.0* 03/27/2014   CALCIUM 8.5 03/26/2014   CALCIUM 8.9 03/19/2014   LFT  Recent Labs  03/26/14 2036 03/27/14 0426  PROT 6.2 5.0*  ALBUMIN 1.7* 1.3*  AST 13 9  ALT 11 9  ALKPHOS 126* 108  BILITOT <0.2* <0.2*   PT/INR Lab Results  Component Value Date   INR 1.06 03/26/2014   C-Diff Negative as per HPI  RADIOLOGY STUDIES: No results found.  ENDOSCOPIC STUDIES: 03/2013  Colonoscopy ENDOSCOPIC IMPRESSION:  1. Non-bleeding mucosal ulceration in the rectum; multiple  biopsies were performed using cold forceps  2. Non-bleeding mucosal ulceration in the sigmoid colon; multiple  biopsies were performed  3. Non-bleeding mucosal ulceration at the appendiceal orifice;  multiple biopsies were performed using cold forceps  4. Normal mucosa in the  terminal ileum  5. The colon mucosa was otherwise normal  RECOMMENDATIONS:  1. 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  2. Start Canasa 1000 mg nightly and Lialda 2.4 g daily   IMPRESSION:   *  Ulcerative colitis. Several weeks of flare with bloody, frequent diarrhea Management complicated by pregnancy (29 weeks at present)  *  Hx ITP with Von Wilebrands tendency.  This may be contributing to the bleeding.   *  Low albumin, suspect protein calorie malnutrition as not eating much for several weeks.   *  Hyponatremia.      PLAN:     *  ? Start Solumedrol.   ? Initiate Cimzia?   Jennye MoccasinSarah Gribbin  03/27/2014, 9:29 AM Pager: [phone]  GI ATTENDING  History, laboratories, x-rays, prior endoscopy reports and pathology reviewed. Patient personally seen and examined. Husband and sister in room. Agree with H&P as outlined above. Pleasant 28 year old who is [redacted] weeks pregnant. Diagnosed with ulcerative colitis (left-sided) 1 year ago. 2 years of symptoms predate diagnosis.. Was doing well until approximately 4 weeks ago when she developed symptoms consistent with a typical flare. Bleeding with mucus and loose stools as well as abdominal cramping. Food avoidance with weight loss. On 40 mg prednisone without significant improvement. Clostridium difficile stool PCR negative x2. Also Canasa suppositories. Question of Lialda exacerbated diarrhea previously. Consideration of Biologics. Now admitted to the hospital with ongoing intractable symptoms. Exam remarkable only for pregnant abdomen. RECOMMENDATIONS 1. Asacol  4.8 g daily 2. Rowasa enemas every morning and  each bedtime 3. Solu-Medrol 40 mg IV every 12 hours 4. Full liquid diet 5. Discuss  this case at Monday morning meeting with input from Dr. Leone Payor. May need Biologics as previously considered. Apparently for transfer to [facility] given the  stage of her pregnancy. We will follow.  Wilhemina Bonito. Eda Keys., M.D. [facility] Division of Gastroenterology

## 2014-03-27 NOTE — Progress Notes (Addendum)
Pt and pt's husband arrive to floor - pt in no s/s of distress. Pt A&Ox4. Pt oriented to room and unit. Skin c/d/i. Whiteboard updated. Callbell within reach. VS stable. Tele applied. Report received from ED nurse prior to pt's arrival. Will continue to monitor. Pt is [redacted] weeks pregnant - OB [facility] - needs GI to see - GI already consulted.

## 2014-03-27 NOTE — Progress Notes (Signed)
INITIAL NUTRITION ASSESSMENT  Pt meets criteria for severe MALNUTRITION in the context of chronic illness as evidenced by <75% estimated energy intake with 7.3% weight loss in the past month.  DOCUMENTATION CODES Per approved criteria  -Severe malnutrition in the context of chronic illness   INTERVENTION: - Educated pt on diet therapy for pregnancy and ulcerative colitis - Resource Breeze TID - Encouraged increased meal intake as tolerated - RD to monitor plan of care   NUTRITION DIAGNOSIS: Altered GI function related to acute ulcerative colitis as evidenced by diarrhea.   Goal: 1. Resolution of diarrhea 2. Advance diet as tolerated to soft diet  Monitor:  Weights, labs, diarrhea, intake, diet advancement  Reason for Assessment: Malnutrition screening tool, consult for assessment  28 y.o. female  Admitting Dx: Ulcerative colitis, acute  ASSESSMENT: Admitted with 2 weeks of severe bloody diarrhea. Hx of ulcerative colitis. Pt is pregnant at [redacted] weeks 4 days per MD. Reports 15 pound unintended weight loss in the past month.   - Met with pt, husband, and other family in room - Pt reports just snacking on bland things like applesauce, bananas, jello, rice, and chicken soup  - States that dairy products and gluten seem to make the diarrhea worse - Reports diarrhea improved from 20 times yesterday to 10 times today and 4 overnight - Had some nausea this morning - Had some broth and pudding on tray today - Tried Ensure before but states it went right through her, as did the grits this morning - States 15 pound unintended weight loss occurred in the past 2 weeks - Has been taking prenatal vitamins regularly at home, getting them daily during admission    Height: Ht Readings from Last 1 Encounters:  03/27/14 6' 1"  (1.854 m)    Weight: Wt Readings from Last 1 Encounters:  03/27/14 203 lb 4.8 oz (92.216 kg)    Ideal Body Weight: 165 lbs  % Ideal Body Weight: 123%  Wt  Readings from Last 10 Encounters:  03/27/14 203 lb 4.8 oz (92.216 kg)  03/19/14 207 lb (93.895 kg)  03/17/14 206 lb 8 oz (93.668 kg)  03/15/14 211 lb 8 oz (95.936 kg)  03/01/14 219 lb 12.8 oz (99.701 kg)  01/19/14 204 lb (92.534 kg)  10/01/13 192 lb (87.091 kg)  07/21/13 194 lb 4 oz (88.111 kg)  06/25/13 188 lb 8 oz (85.503 kg)  06/01/13 191 lb 9.6 oz (86.909 kg)    Usual Body Weight: 218 lbs   % Usual Body Weight: 93%  BMI:  Body mass index is 26.83 kg/(m^2).  Estimated Nutritional Needs: Kcal: 2300-2500 Protein: 80-95g Fluid: per MD  Skin: Intact   Diet Order: Full Liquid  EDUCATION NEEDS: -Education needs addressed - reviewed diet therapy for pregnancy and ulcerative colitis with pt and provided handouts with RD contact information    Intake/Output Summary (Last 24 hours) at 03/27/14 1525 Last data filed at 03/27/14 0615  Gross per 24 hour  Intake   2490 ml  Output      0 ml  Net   2490 ml    Last BM: 7/4  Labs:   Recent Labs Lab 03/26/14 2036 03/26/14 2040 03/27/14 0426  NA 133* 133* 135*  K 3.9 4.6 4.4  CL 98 100 101  CO2 22  --  22  BUN 5* <3* 5*  CREATININE 0.49* 0.50 0.52  CALCIUM 8.5  --  8.0*  GLUCOSE 102* 103* 108*    CBG (last 3)  No  results found for this basename: GLUCAP,  in the last 72 hours  Scheduled Meds: . Mesalamine  2,400 mg Oral BID  . mesalamine  4 g Rectal BID  . methylPREDNISolone (SOLU-MEDROL) injection  40 mg Intravenous Q12H  . sodium chloride  3 mL Intravenous Q12H    Continuous Infusions: . sodium chloride 100 mL/hr at 03/27/14 0121    Past Medical History  Diagnosis Date  . ITP (idiopathic thrombocytopenic purpura)     Question of associated tendency towards von Willebrand  . Menorrhagia     Associated with thrombocytopenia, 2 episodes in teenage years responded the platelet infusion  . Left sided ulcerative (chronic) colitis w/ appendiceal inflammaition 03/11/2013    Past Surgical History  Procedure  Laterality Date  . Tonsillectomy    . Colonoscopy w/ biopsies  03/24/2013    Carlis Stable MS, RD, LDN [phone] Weekend/After Hours Pager

## 2014-03-27 NOTE — ED Provider Notes (Signed)
CSN: [id number]     Arrival date & time 03/26/14  2011 History   First MD Initiated Contact with Patient 03/26/14 2034     Chief Complaint  Patient presents with  . Diarrhea  . Rectal Bleeding  . Tachycardia    28 y/o female G1P0 who is 4443w3d pregnant with PMH of UC that presents with UC flare. Patient states that over the last month she has had pain and 15 pound weight loss despite being pregnant. These symptoms became acutely worse yesterday and she has been having constant bloody diarrhea.  She is currently on 40mg  prednisone with symptomatic relief with percocet and failing therapy. She has had some nausea/vomting. She has abdominal cramping and pain that feels like her UC flair. She denies fever/chills, cough/congestion. She denies contractions, leakage of fluid, vaginal bleeding, or decreased fetal movement.     (Consider location/radiation/quality/duration/timing/severity/associated sxs/prior Treatment) Patient is a 28 y.o. female presenting with diarrhea, hematochezia, and abdominal pain.  Diarrhea Associated symptoms: abdominal pain   Associated symptoms: no headaches and no vomiting   Rectal Bleeding Associated symptoms: abdominal pain   Associated symptoms: no vomiting   Abdominal Pain Pain location:  Epigastric, suprapubic and periumbilical Pain quality: cramping   Pain radiates to:  Does not radiate Pain severity:  Severe Onset quality:  Gradual Timing:  Constant Associated symptoms: diarrhea and hematochezia   Associated symptoms: no chest pain, no constipation, no cough, no dysuria, no nausea, no shortness of breath, no vaginal discharge and no vomiting     Past Medical History  Diagnosis Date  . ITP (idiopathic thrombocytopenic purpura)     Question of associated tendency towards von Willebrand  . Menorrhagia     Associated with thrombocytopenia, 2 episodes in teenage years responded the platelet infusion  . Left sided ulcerative (chronic) colitis w/ appendiceal  inflammaition 03/11/2013   Past Surgical History  Procedure Laterality Date  . Tonsillectomy    . Colonoscopy w/ biopsies  03/24/2013   Family History  Problem Relation Age of Onset  . Diabetes Father   . Diabetes Paternal Grandfather   . Heart disease Paternal Grandfather   . Diabetes Maternal Grandmother   . Heart disease Maternal Grandmother   . Irritable bowel syndrome Paternal Grandmother   . Colon cancer Neg Hx   . Crohn's disease Neg Hx   . Kidney disease Neg Hx   . Liver disease Neg Hx    History  Substance Use Topics  . Smoking status: Never Smoker   . Smokeless tobacco: Never Used  . Alcohol Use: Yes     Comment: OCC.; none while pregnant   OB History   Grav Para Term Preterm Abortions TAB SAB Ect Mult Living   1              Review of Systems  Constitutional: Positive for appetite change. Negative for activity change.  HENT: Negative for congestion.   Respiratory: Negative for cough and shortness of breath.   Cardiovascular: Negative for chest pain and leg swelling.  Gastrointestinal: Positive for abdominal pain, diarrhea, blood in stool and hematochezia. Negative for nausea, vomiting, constipation and abdominal distention.  Genitourinary: Negative for dysuria, flank pain and vaginal discharge.  Musculoskeletal: Negative for back pain.  Skin: Negative for color change.  Neurological: Negative for syncope and headaches.  Psychiatric/Behavioral: Negative for agitation.      Allergies  Mesalamine er and Phenergan  Home Medications   Prior to Admission medications   Medication Sig Start Date End  Date Taking? Authorizing Provider  acetaminophen (TYLENOL) 500 MG tablet Take 1,000 mg by mouth daily as needed (pain).   Yes Historical Provider, MD  cefUROXime (CEFTIN) 250 MG tablet Take 250 mg by mouth 2 (two) times daily with a meal. 7 day course filled 03/19/14   Yes Historical Provider, MD  OVER THE COUNTER MEDICATION Take 3 tablets by mouth at bedtime. Garden  of Life Prenatal has iron and probiotic in it.   Yes Historical Provider, MD  oxyCODONE-acetaminophen (PERCOCET/ROXICET) 5-325 MG per tablet Take 1-2 tablets by mouth every 6 (six) hours as needed (pain).    Yes Historical Provider, MD  predniSONE (DELTASONE) 10 MG tablet Take 40 mg by mouth at bedtime. For inflammation from ulcerative colitis   Yes Historical Provider, MD  Probiotic Product (PROBIOTIC PO) Take 1 tablet by mouth daily.   Yes Historical Provider, MD  ranitidine (ZANTAC) 300 MG tablet Take 300 mg by mouth daily as needed for heartburn.   Yes Historical Provider, MD   BP 119/82  Pulse 96  Temp(Src) 97.9 F (36.6 C) (Oral)  Resp 20  SpO2 96%  LMP 04/15/2013 Physical Exam  Constitutional: She is oriented to person, place, and time. She appears well-developed.  HENT:  Head: Normocephalic.  Eyes: Pupils are equal, round, and reactive to light.  Neck: Neck supple.  Cardiovascular: Normal rate.  Exam reveals no gallop and no friction rub.   No murmur heard. Pulmonary/Chest: Effort normal and breath sounds normal. No respiratory distress.  Abdominal: Soft. She exhibits no distension. There is tenderness in the epigastric area, periumbilical area and suprapubic area. There is no rigidity, no rebound, no guarding, no tenderness at McBurney's point and negative Murphy's sign.  Gravid with midline tenderness   Musculoskeletal: She exhibits no edema.  Neurological: She is alert and oriented to person, place, and time.  Skin: Skin is warm.  Psychiatric: She has a normal mood and affect.    ED Course  Procedures (including critical care time) Labs Review Labs Reviewed  CBC - Abnormal; Notable for the following:    WBC 18.3 (*)    Platelets 435 (*)    All other components within normal limits  COMPREHENSIVE METABOLIC PANEL - Abnormal; Notable for the following:    Sodium 133 (*)    Glucose, Bld 102 (*)    BUN 5 (*)    Creatinine, Ser 0.49 (*)    Albumin 1.7 (*)    Alkaline  Phosphatase 126 (*)    Total Bilirubin <0.2 (*)    All other components within normal limits  DIFFERENTIAL - Abnormal; Notable for the following:    Neutro Abs 14.5 (*)    Monocytes Absolute 1.4 (*)    Neutrophils Relative % 80 (*)    All other components within normal limits  I-STAT CHEM 8, ED - Abnormal; Notable for the following:    Sodium 133 (*)    BUN <3 (*)    Glucose, Bld 103 (*)    Calcium, Ion 1.11 (*)    All other components within normal limits  PROTIME-INR  I-STAT CG4 LACTIC ACID, ED  TYPE AND SCREEN  ABO/RH    Imaging Review No results found.   EKG Interpretation None      MDM   28 y/o female [redacted]w[redacted]d pregnant with ulcerative colitisis flair that is presenting with severe bloody diarrhea. Patient states that she has lost 15 pounds with pregnancy. She is currently having a severe flair of UC with associated abdominal pain  that she reports is here typical UC pain. Patient is currently on prednisone 40mg  and is failing current therapy.   On initial presentation the patient was tachycardic to 150's and responded well to IV fluid bolus. She continued to have abdominal pain and had some relief from morphine. It was determined that the patient would need admission.   OB/GYN was contacted and via the OB rapid response nurse the patient was monitored with a 20 minute strip. The patient has no complaints of pregnancy and OB/GYN Dr. Tenny Crawoss signed off on fetal well being.   GI Dr. Marina GoodellPerry was consulted and recommends hydration overnight and continuation of prednisone. They will see the patient in the morning for further evaluation.   Internal medicine Dr. Allena KatzPatel, admitted the patient with GI consult and with OB rapid assess nursing available for any concerns of pregnancy.     Final diagnoses:  Ulcerative colitis, unspecified complication       Clement SayresStaci Lylee Corrow, MD 03/27/14 [phone]

## 2014-03-28 DIAGNOSIS — E43 Unspecified severe protein-calorie malnutrition: Secondary | ICD-10-CM | POA: Insufficient documentation

## 2014-03-28 MED ORDER — ONDANSETRON HCL 4 MG PO TABS
4.0000 mg | ORAL_TABLET | Freq: Four times a day (QID) | ORAL | Status: DC
  Administered 2014-03-28 – 2014-04-14 (×54): 4 mg via ORAL
  Filled 2014-03-28 (×73): qty 1

## 2014-03-28 MED ORDER — ONDANSETRON HCL 4 MG/2ML IJ SOLN
4.0000 mg | Freq: Four times a day (QID) | INTRAMUSCULAR | Status: DC
  Administered 2014-03-29 – 2014-04-06 (×9): 4 mg via INTRAVENOUS
  Filled 2014-03-28 (×9): qty 2

## 2014-03-28 MED ORDER — LOPERAMIDE HCL 2 MG PO CAPS
2.0000 mg | ORAL_CAPSULE | Freq: Four times a day (QID) | ORAL | Status: DC
Start: 2014-03-28 — End: 2014-04-14
  Administered 2014-03-28 – 2014-04-14 (×68): 2 mg via ORAL
  Filled 2014-03-28 (×77): qty 1

## 2014-03-28 MED ORDER — LOPERAMIDE HCL 2 MG PO CAPS
4.0000 mg | ORAL_CAPSULE | Freq: Once | ORAL | Status: AC
Start: 2014-03-28 — End: 2014-03-28
  Administered 2014-03-28: 4 mg via ORAL
  Filled 2014-03-28: qty 2

## 2014-03-28 MED ORDER — ENSURE PUDDING PO PUDG
1.0000 | Freq: Three times a day (TID) | ORAL | Status: DC
  Administered 2014-03-28 (×2): 1 via ORAL

## 2014-03-28 NOTE — Progress Notes (Signed)
          Daily Rounding Note  03/28/2014, 8:27 AM  LOS: 2 days   SUBJECTIVE:       16 or so Bloody BMs yesterday, some small, some larger.  Cramping pain comes and goes.  Morphine helps.  Nausea somewhat lessened.  Tolerating clears.  Note dx of severe  malnutrition per RD note. Able to retain enemas for max of 30 minutes   OBJECTIVE:         Vital signs in last 24 hours:    Temp:  [97.4 F (36.3 C)-98.1 F (36.7 C)] 97.4 F (36.3 C) (07/05 0646) Pulse Rate:  [78-94] 78 (07/05 0646) Resp:  [18] 18 (07/05 0646) BP: (118-123)/(76-79) 118/77 mmHg (07/05 0646) SpO2:  [96 %-97 %] 97 % (07/05 0646) Weight:  [97.16 kg (214 lb 3.2 oz)] 97.16 kg (214 lb 3.2 oz) (07/05 0609) Last BM Date: 03/27/14 General: looks pale but not ill.  Alert, comfortable   Heart: RRR Chest: clear bil.  No resp difficulty Abdomen: pregnant, mild tenderness on left.  Extremities: no CCE Neuro/Psych:  Pleasant, relaxed.   Intake/Output from previous day: 07/04 0701 - 07/05 0700 In: 2383.3 [I.V.:2383.3] Out: -   Intake/Output this shift:    Lab Results:  Recent Labs  03/26/14 2036 03/26/14 2040 03/27/14 0426  WBC 18.3*  --  13.0*  HGB 12.0 13.3 10.4*  HCT 36.0 39.0 31.4*  PLT 435*  --  365  MCV 82  BMET  Recent Labs  03/26/14 2036 03/26/14 2040 03/27/14 0426  NA 133* 133* 135*  K 3.9 4.6 4.4  CL 98 100 101  CO2 22  --  22  GLUCOSE 102* 103* 108*  BUN 5* <3* 5*  CREATININE 0.49* 0.50 0.52  CALCIUM 8.5  --  8.0*   LFT  Recent Labs  03/26/14 2036 03/27/14 0426  PROT 6.2 5.0*  ALBUMIN 1.7* 1.3*  AST 13 9  ALT 11 9  ALKPHOS 126* 108  BILITOT <0.2* <0.2*   PT/INR  Recent Labs  03/26/14 2036  LABPROT 13.8  INR 1.06    ASSESMENT:   * Ulcerative colitis. Several weeks of flare with bloody, frequent diarrhea  Management complicated by pregnancy (29 weeks at present)  Mesalamine enemas and Asacol, Solumedrol initiated  7/4.  *  Pregnancy, 29 weeks.  Fetal testing 7/4 said to be reassuring per pt d/w administering RN.  * Hx ITP with Von Wilebrands tendency. This may be contributing to the bleeding.  * Low albumin, suspect protein calorie malnutrition as not eating much for several weeks.  * Hyponatremia. *  Anemia. Normocytic.     PLAN   *  BMET in AM to assess glucose now that she is on higher steroid dose.  *  Continue all current meds. *  Trial of low fiber diet, if not tolerated regress diet.  Skipping full liquids as this is heavy on dairy, which she does not tolerate well.  *  Will discuss initiation of biologic (Cimzia vs Humira) with Dr Leone PayorGessner this week.     Jennye MoccasinSarah Gribbin  03/28/2014, 8:27 AM Pager: [phone]  GI ATTENDING  Interval history and data reviewed. Patient personally seen and examined. Agree with H&P as above. Feels much better today. Had a good night rest. Still with significant bloody diarrhea and cramping. Continue with current medical therapies.  Wilhemina BonitoJohn N. Eda KeysPerry, Jr., M.D. [facility] Division of Gastroenterology

## 2014-03-28 NOTE — Progress Notes (Signed)
[facility] Progress Note   Blanch A. Hyacinth Willis RUE:[id number] DOB: [birth year] DOA: 03/26/2014 PCP: Loney LaurenceHORVATH,MICHELLE A, MD  Brief narrative: Deanna Willis is a 28 y.o. female who is [redacted] wks pregnant presents with an ulcerative colitis flare starting about 1 mo ago. She is having profuse diarrhea and has been on Prednisone 40 mg daily without improvement. Dr Leone PayorGessner was considering starting her on Humira. She has been started on Solu-medrol 40 ming IV daily on admission.  OB, Dr Tenny Crawoss is on call for Dr Henderson CloudHorvath who is the patient's OB. Dr T[facility] Crawoss [facility] There is an OB rapid response nurse who is coming daily to perform a NST.    Subjective: Stool less frequent and less bloody- feels that Imodium helps last night.   Assessment/Plan: Principal Problem:   Ulcerative colitis, acute - management per GI - started on Asacol, Rowasa enemas BID and full liquids- increase Solumedrol to 40 BID - pt intolerant of dairy products- GI has changed to low fiber diet today  Dehydration - cont maintenance IVF - urinating well.   Nausea - change Zofran to QID routine rather than PRN  Severe protein cal malnutrition - does not like Breeze- add Ensure pudding- Vanilla preferred by patient    Pregnant and not yet delivered in third trimester - per OB service  Anemia - follow    Code Status: Full code Family Communication: none Disposition Plan: home when stable  Consultants: GI  Procedures: none  Antibiotics: Anti-infectives   None       DVT prophylaxis: SCDs  Objective: Filed Weights   03/27/14 0225 03/28/14 0609  Weight: 92.216 kg (203 lb 4.8 oz) 97.16 kg (214 lb 3.2 oz)    Vitals Filed Vitals:   03/27/14 1421 03/27/14 2244 03/28/14 0609 03/28/14 0646  BP: 119/76 123/79  118/77  Pulse: 90 94  78  Temp: 98.1 F (36.7 C) 98 F (36.7 C)  97.4 F (36.3 C)  TempSrc: Oral Oral  Oral  Resp: 18 18  18   Height:      Weight:   97.16 kg  (214 lb 3.2 oz)   SpO2: 97% 96%  97%      Intake/Output Summary (Last 24 hours) at 03/28/14 1227 Last data filed at 03/28/14 0955  Gross per 24 hour  Intake 2386.33 ml  Output      0 ml  Net 2386.33 ml     Exam: General: No acute respiratory distress Lungs: Clear to auscultation bilaterally without wheezes or crackles Cardiovascular: Regular rate and rhythm without murmur gallop or rub normal S1 and S2 Abdomen: diffuse tenderness, nondistended, soft, bowel sounds positive, no rebound, no ascites, no appreciable mass Extremities: No significant cyanosis, clubbing, or edema bilateral lower extremities  Data Reviewed: Basic Metabolic Panel:  Recent Labs Lab 03/26/14 2036 03/26/14 2040 03/27/14 0426  NA 133* 133* 135*  K 3.9 4.6 4.4  CL 98 100 101  CO2 22  --  22  GLUCOSE 102* 103* 108*  BUN 5* <3* 5*  CREATININE 0.49* 0.50 0.52  CALCIUM 8.5  --  8.0*   Liver Function Tests:  Recent Labs Lab 03/26/14 2036 03/27/14 0426  AST 13 9  ALT 11 9  ALKPHOS 126* 108  BILITOT <0.2* <0.2*  PROT 6.2 5.0*  ALBUMIN 1.7* 1.3*   No results found for this basename: LIPASE, AMYLASE,  in the last 168 hours No results found for this basename: AMMONIA,  in the last 168 hours  CBC:  Recent Labs Lab 03/26/14 2030 03/26/14 2036 03/26/14 2040 03/27/14 0426  WBC  --  18.3*  --  13.0*  NEUTROABS 14.5*  --   --  11.3*  HGB  --  12.0 13.3 10.4*  HCT  --  36.0 39.0 31.4*  MCV  --  80.5  --  82.4  PLT  --  435*  --  365   Cardiac Enzymes: No results found for this basename: CKTOTAL, CKMB, CKMBINDEX, TROPONINI,  in the last 168 hours BNP (last 3 results) No results found for this basename: PROBNP,  in the last 8760 hours CBG: No results found for this basename: GLUCAP,  in the last 168 hours  Recent Results (from the past 240 hour(s))  CLOSTRIDIUM DIFFICILE BY PCR     Status: None   Collection Time    03/27/14  2:58 AM      Result Value Ref Range Status   C difficile by pcr  NEGATIVE  NEGATIVE Final     Studies:  Recent x-ray studies have been reviewed in detail by the Attending Physician  Scheduled Meds:  Scheduled Meds: . feeding supplement (ENSURE)  1 Container Oral TID BM  . loperamide  2 mg Oral QID  . Mesalamine  2,400 mg Oral BID  . mesalamine  4 g Rectal BID  . methylPREDNISolone (SOLU-MEDROL) injection  40 mg Intravenous Q12H  . prenatal multivitamin  1 tablet Oral Q1200  . sodium chloride  3 mL Intravenous Q12H   Continuous Infusions: . sodium chloride 100 mL/hr at 03/28/14 0539    Time spent on care of this patient: 35 min   Usbaldo Pannone, MD 03/28/2014, 12:27 PM  LOS: 2 days   [facility] Office  [phone] Pager - Text Page per Loretha StaplerAmion   If 7PM-7AM, please contact night-coverage [email or web address]

## 2014-03-28 NOTE — Progress Notes (Signed)
On call physician paged for diarrhea med per pt request.

## 2014-03-29 LAB — CBC
HCT: 27.7 % — ABNORMAL LOW (ref 36.0–46.0)
HEMOGLOBIN: 9.1 g/dL — AB (ref 12.0–15.0)
MCH: 26.6 pg (ref 26.0–34.0)
MCHC: 32.9 g/dL (ref 30.0–36.0)
MCV: 81 fL (ref 78.0–100.0)
Platelets: 358 10*3/uL (ref 150–400)
RBC: 3.42 MIL/uL — AB (ref 3.87–5.11)
RDW: 15.2 % (ref 11.5–15.5)
WBC: 14.1 10*3/uL — AB (ref 4.0–10.5)

## 2014-03-29 LAB — GI PATHOGEN PANEL BY PCR, STOOL
C DIFFICILE TOXIN A/B: NEGATIVE
Campylobacter by PCR: NEGATIVE
Cryptosporidium by PCR: NEGATIVE
E coli (ETEC) LT/ST: NEGATIVE
E coli (STEC): NEGATIVE
E coli 0157 by PCR: NEGATIVE
G lamblia by PCR: NEGATIVE
NOROVIRUS G1/G2: NEGATIVE
ROTAVIRUS A BY PCR: NEGATIVE
SALMONELLA BY PCR: NEGATIVE
Shigella by PCR: NEGATIVE

## 2014-03-29 LAB — BASIC METABOLIC PANEL
Anion gap: 10 (ref 5–15)
BUN: 3 mg/dL — ABNORMAL LOW (ref 6–23)
CALCIUM: 8.1 mg/dL — AB (ref 8.4–10.5)
CHLORIDE: 101 meq/L (ref 96–112)
CO2: 23 meq/L (ref 19–32)
Creatinine, Ser: 0.5 mg/dL (ref 0.50–1.10)
GFR calc Af Amer: >90 mL/min (ref >90)
GFR calc non Af Amer: >90 mL/min (ref >90)
GLUCOSE: 117 mg/dL — AB (ref 70–99)
Potassium: 3.9 mEq/L (ref 3.7–5.3)
SODIUM: 134 meq/L — AB (ref 137–147)

## 2014-03-29 MED ORDER — ADALIMUMAB 40 MG/0.8ML ~~LOC~~ PSKT
160.0000 mg | PREFILLED_SYRINGE | Freq: Once | SUBCUTANEOUS | Status: AC
  Administered 2014-03-30: 160 mg via SUBCUTANEOUS
  Filled 2014-03-29 (×2): qty 160

## 2014-03-29 MED ORDER — HYDROCORTISONE 100 MG/60ML RE ENEM
100.0000 mg | ENEMA | Freq: Every day | RECTAL | Status: DC
Start: 2014-03-29 — End: 2014-04-14
  Administered 2014-03-29 – 2014-04-14 (×17): 100 mg via RECTAL
  Filled 2014-03-29 (×19): qty 1

## 2014-03-29 NOTE — Telephone Encounter (Signed)
[facility]

## 2014-03-29 NOTE — Progress Notes (Signed)
Utilization review completed.  

## 2014-03-29 NOTE — Progress Notes (Addendum)
[facility] Progress Note    Since last GI note: Rowasa enemas followed by worsening cramping, urgency.  In between the enemas she thinks she may be a bit better.  Objective: Vital signs in last 24 hours: Temp:  [98.3 F (36.8 C)-98.7 F (37.1 C)] 98.7 F (37.1 C) (07/06 0410) Pulse Rate:  [75-91] 75 (07/06 0410) Resp:  [18-20] 18 (07/06 0410) BP: (108-131)/(64-81) 108/69 mmHg (07/06 0410) SpO2:  [96 %-98 %] 98 % (07/06 0410) Weight:  [213 lb (96.616 kg)] 213 lb (96.616 kg) (07/06 0410) Last BM Date: 03/29/14 General: alert and oriented times 3 Heart: regular rate and rythm Abdomen: soft, mildly tender, non-distended, normal bowel sounds   Lab Results:  Recent Labs  03/26/14 2036 03/26/14 2040 03/27/14 0426 03/29/14 0444  WBC 18.3*  --  13.0* 14.1*  HGB 12.0 13.3 10.4* 9.1*  PLT 435*  --  365 358  MCV 80.5  --  82.4 81.0    Recent Labs  03/26/14 2036 03/26/14 2040 03/27/14 0426 03/29/14 0444  NA 133* 133* 135* 134*  K 3.9 4.6 4.4 3.9  CL 98 100 101 101  CO2 22  --  22 23  GLUCOSE 102* 103* 108* 117*  BUN 5* <3* 5* 3*  CREATININE 0.49* 0.50 0.52 0.50  CALCIUM 8.5  --  8.0* 8.1*    Recent Labs  03/26/14 2036 03/27/14 0426  PROT 6.2 5.0*  ALBUMIN 1.7* 1.3*  AST 13 9  ALT 11 9  ALKPHOS 126* 108  BILITOT <0.2* <0.2*    Recent Labs  03/26/14 2036  INR 1.06     Studies/Results: No results found.   Medications: Scheduled Meds: . feeding supplement (ENSURE)  1 Container Oral TID BM  . loperamide  2 mg Oral QID  . Mesalamine  2,400 mg Oral BID  . mesalamine  4 g Rectal BID  . methylPREDNISolone (SOLU-MEDROL) injection  40 mg Intravenous Q12H  . ondansetron (ZOFRAN) IV  4 mg Intravenous 4 times per day   Or  . ondansetron  4 mg Oral 4 times per day  . prenatal multivitamin  1 tablet Oral Q1200  . sodium chloride  3 mL Intravenous Q12H   Continuous Infusions: . sodium chloride 100 mL/hr at 03/29/14 0400   PRN  Meds:.acetaminophen, morphine injection, oxyCODONE    Assessment/Plan: 28 y.o. female 1729 weeks pregnant, with known UC  UC often flares during pregnancy.  A year ago she had inflammation up to about 25cm from anus (fairly limited, left sided disease).  She may have more of her colon involved now or that same segment could be more inflamed.  I am concerned that she is having mesalamine reactions (oral and rectal mesalamine restarted 2 days ago).  Primary GI thought similarly.  I am going to stop her asacol and rowasa. Will start cortenema instead, continue IV solumedrol 40 twice daily. If no clearly improving in next 48 hours or so, she may need biologic (cimzia does not cross placenta).    Rachael FeeJacobs, Ashwika Freels P, MD  03/29/2014, 8:36 AM [facility] Pager [phone]   I discussed her case with Dr. Leone PayorGessner who knows her very well, have arranged for her to start humira tomorrow (160mg  Seba Dalkai).

## 2014-03-30 DIAGNOSIS — R600 Localized edema: Secondary | ICD-10-CM | POA: Diagnosis not present

## 2014-03-30 DIAGNOSIS — R110 Nausea: Secondary | ICD-10-CM | POA: Diagnosis present

## 2014-03-30 LAB — BASIC METABOLIC PANEL
ANION GAP: 12 (ref 5–15)
BUN: 3 mg/dL — ABNORMAL LOW (ref 6–23)
CHLORIDE: 101 meq/L (ref 96–112)
CO2: 24 meq/L (ref 19–32)
Calcium: 8.3 mg/dL — ABNORMAL LOW (ref 8.4–10.5)
Creatinine, Ser: 0.4 mg/dL — ABNORMAL LOW (ref 0.50–1.10)
GFR calc Af Amer: >90 mL/min (ref >90)
GFR calc non Af Amer: >90 mL/min (ref >90)
GLUCOSE: 123 mg/dL — AB (ref 70–99)
POTASSIUM: 4 meq/L (ref 3.7–5.3)
SODIUM: 137 meq/L (ref 137–147)

## 2014-03-30 NOTE — Progress Notes (Signed)
OB Rapid Response: Here for daily NST. Baby active, no uterine cramping or contractions. FH 145 with spont accels. Pt reports no vag discharge or bleeding. Now OB questions ro concerns at this time Reassuring NST.

## 2014-03-30 NOTE — Progress Notes (Addendum)
[facility] Progress Note   Tenzin A. Hyacinth MeekerMiller JXB:[id number] DOB: 1986/02/20 DOA: 03/26/2014 PCP: Loney LaurenceHORVATH,MICHELLE A, MD  Brief narrative: Lyman SpellerEmily A. Hyacinth MeekerMiller is a 28 y.o. female who is [redacted] wks pregnant presents with an ulcerative colitis flare starting about 1 mo ago. She is having profuse diarrhea and has been on Prednisone 40 mg daily without improvement. Dr Leone PayorGessner was considering starting her on Humira. She has been started on Solu-medrol 40 ming IV daily on admission.  OB, Dr Tenny Crawoss is on call for Dr Henderson CloudHorvath who is the patient's OB. Dr Tenny Crawoss [facility]. There is an OB rapid response nurse who is coming daily to perform a NST.    Subjective: Eating has improved- having protein everyday- urinating frequently despite stopping IVF yesterday. abd pain and diarrhea a little better.  Assessment/Plan: Principal Problem:   Ulcerative colitis, acute - on Lomotil QID  - to start Humira today - pt intolerant of dairy products- cont diet per GI  Dehydration - stopped IVF due to pedal edema - urinating well.   Nausea - I changed Zofran to QID routine rather than PRN- working very well  Severe protein cal malnutrition - does not like Breeze-and cannot tolerate Ensure pudding - eating eggs and tuna daily  Pedal edema - due to IVF, pro-cal malnutrition and pregnancy - elevated legs- follow off on IVF    Pregnant and not yet delivered in third trimester - per OB service - [facility] due to her being a high risk pregnancy however the OB on call [facility] - Unfortunately, if she was to start laboring, her contractions may be misinterpreted as cramping due to colitis- -I still recommend that she have continuous maternal fetal monitoring rather than only 30 min a day  Anemia - follow - pt has not been on Iron supplements at home    Code Status: Full code Disposition Plan: home when  stable  Consultants: GI  Procedures: none  Antibiotics: Anti-infectives   None       DVT prophylaxis: SCDs  Objective: Filed Weights   03/28/14 0609 03/29/14 0410 03/30/14 0430  Weight: 97.16 kg (214 lb 3.2 oz) 96.616 kg (213 lb) 94.076 kg (207 lb 6.4 oz)    Vitals Filed Vitals:   03/29/14 0410 03/29/14 1344 03/29/14 2041 03/30/14 0430  BP: 108/69 107/69 126/76 110/67  Pulse: 75 93 81 74  Temp: 98.7 F (37.1 C) 98.2 F (36.8 C) 98.6 F (37 C) 98.8 F (37.1 C)  TempSrc: Oral Oral Tympanic Oral  Resp: 18 18 18 18   Height:      Weight: 96.616 kg (213 lb)   94.076 kg (207 lb 6.4 oz)  SpO2: 98% 98% 97% 95%      Intake/Output Summary (Last 24 hours) at 03/30/14 1310 Last data filed at 03/30/14 [id number]  Gross per 24 hour  Intake      6 ml  Output      0 ml  Net      6 ml     Exam: General: No acute respiratory distress Lungs: Clear to auscultation bilaterally without wheezes or crackles Cardiovascular: Regular rate and rhythm without murmur gallop or rub normal S1 and S2 Abdomen: diffuse tenderness, nondistended, soft, bowel sounds positive, no rebound, no ascites, no appreciable mass Extremities: No significant cyanosis, clubbing, or edema bilateral lower extremities  Data Reviewed: Basic Metabolic Panel:  Recent Labs Lab 03/26/14  2036 03/26/14 2040 03/27/14 0426 03/29/14 0444 03/30/14 0840  NA 133* 133* 135* 134* 137  K 3.9 4.6 4.4 3.9 4.0  CL 98 100 101 101 101  CO2 22  --  22 23 24   GLUCOSE 102* 103* 108* 117* 123*  BUN 5* <3* 5* 3* 3*  CREATININE 0.49* 0.50 0.52 0.50 0.40*  CALCIUM 8.5  --  8.0* 8.1* 8.3*   Liver Function Tests:  Recent Labs Lab 03/26/14 2036 03/27/14 0426  AST 13 9  ALT 11 9  ALKPHOS 126* 108  BILITOT <0.2* <0.2*  PROT 6.2 5.0*  ALBUMIN 1.7* 1.3*   No results found for this basename: LIPASE, AMYLASE,  in the last 168 hours No results found for this basename: AMMONIA,  in the last 168 hours CBC:  Recent  Labs Lab 03/26/14 2030 03/26/14 2036 03/26/14 2040 03/27/14 0426 03/29/14 0444  WBC  --  18.3*  --  13.0* 14.1*  NEUTROABS 14.5*  --   --  11.3*  --   HGB  --  12.0 13.3 10.4* 9.1*  HCT  --  36.0 39.0 31.4* 27.7*  MCV  --  80.5  --  82.4 81.0  PLT  --  435*  --  365 358   Cardiac Enzymes: No results found for this basename: CKTOTAL, CKMB, CKMBINDEX, TROPONINI,  in the last 168 hours BNP (last 3 results) No results found for this basename: PROBNP,  in the last 8760 hours CBG: No results found for this basename: GLUCAP,  in the last 168 hours  Recent Results (from the past 240 hour(s))  CLOSTRIDIUM DIFFICILE BY PCR     Status: None   Collection Time    03/27/14  2:58 AM      Result Value Ref Range Status   C difficile by pcr NEGATIVE  NEGATIVE Final     Studies:  Recent x-ray studies have been reviewed in detail by the Attending Physician  Scheduled Meds:  Scheduled Meds: . Adalimumab  160 mg Subcutaneous Once  . hydrocortisone  100 mg Rectal QPC breakfast  . loperamide  2 mg Oral QID  . methylPREDNISolone (SOLU-MEDROL) injection  40 mg Intravenous Q12H  . ondansetron (ZOFRAN) IV  4 mg Intravenous 4 times per day   Or  . ondansetron  4 mg Oral 4 times per day  . prenatal multivitamin  1 tablet Oral Q1200  . sodium chloride  3 mL Intravenous Q12H   Continuous Infusions:    Time spent on care of this patient: 35 min   Marsella Suman, MD 03/30/2014, 1:10 PM  LOS: 4 days   [facility] Office  [phone] Pager - Text Page per Loretha StaplerAmion   If 7PM-7AM, please contact night-coverage [email or web address]

## 2014-03-30 NOTE — Progress Notes (Signed)
[facility] Progress Note    Since last GI note: Mesalamine products stopped yesterday, humira 160mg  ordered and to be given this afternoon.  She feels like past 24 hours has been overall better (less diarrhea, less cramping, less bleeding, got some sleep last night).  Objective: Vital signs in last 24 hours: Temp:  [98.2 F (36.8 C)-98.8 F (37.1 C)] 98.8 F (37.1 C) (07/07 0430) Pulse Rate:  [74-93] 74 (07/07 0430) Resp:  [18] 18 (07/07 0430) BP: (107-126)/(67-76) 110/67 mmHg (07/07 0430) SpO2:  [95 %-98 %] 95 % (07/07 0430) Weight:  [207 lb 6.4 oz (94.076 kg)] 207 lb 6.4 oz (94.076 kg) (07/07 0430) Last BM Date: 03/29/14 General: alert and oriented times 3 Heart: regular rate and rythm Abdomen: soft, non-tender, non-distended, normal bowel sounds   Lab Results:  Recent Labs  03/29/14 0444  WBC 14.1*  HGB 9.1*  PLT 358  MCV 81.0    Recent Labs  03/29/14 0444  NA 134*  K 3.9  CL 101  CO2 23  GLUCOSE 117*  BUN 3*  CREATININE 0.50  CALCIUM 8.1*   Studies/Results: No results found.   Medications: Scheduled Meds: . Adalimumab  160 mg Subcutaneous Once  . hydrocortisone  100 mg Rectal QPC breakfast  . loperamide  2 mg Oral QID  . methylPREDNISolone (SOLU-MEDROL) injection  40 mg Intravenous Q12H  . ondansetron (ZOFRAN) IV  4 mg Intravenous 4 times per day   Or  . ondansetron  4 mg Oral 4 times per day  . prenatal multivitamin  1 tablet Oral Q1200  . sodium chloride  3 mL Intravenous Q12H   Continuous Infusions:  PRN Meds:.acetaminophen, morphine injection, oxyCODONE    Assessment/Plan: 28 y.o. female with UC, pregnant [redacted]weeks  Humira loading dose this afternoon.  Will continue IV solumedrol 40 twice daily, once daily hydrocortisone enemas for now.  I offered to increase her diet from softs, but she wants to wait another 1-2 days.   Rachael FeeJacobs, Kristianna Saperstein P, MD  03/30/2014, 7:27 AM [facility] Pager [phone]

## 2014-03-31 MED FILL — Medication: Qty: 1 | Status: AC

## 2014-03-31 NOTE — Telephone Encounter (Signed)
Humira was approved, patient is still inpatient and had her initial loading dose in the hospital yesterday.  I have spoken with [employer] and advised them of the approval.  [facility] [phone]. Approval information given to the pharmacy [id number] from 03/29/2014- 09/23/2038.  They will contact the patient for shipment

## 2014-03-31 NOTE — Progress Notes (Signed)
[facility] PROGRESS NOTE  Deanna Willis [id number] DOB: [birth year] DOA: 03/26/2014 PCP: Loney LaurenceHORVATH,MICHELLE A, MD  Assessment/Plan: Ulcerative colitis, acute  - on Lomotil QID  - to start Humira today  - pt intolerant of dairy products- cont diet per GI  - Cont IV steroids for another 2-3 days, and possibly transition to PO and d/c home thereafter Dehydration  - stopped IVF due to pedal edema - urinating well.  Nausea  - I changed Zofran to QID routine rather than PRN- working very well  Severe protein cal malnutrition  - does not like Breeze-and cannot tolerate Ensure pudding  - eating eggs and tuna daily  Pedal edema  - due to IVF, pro-cal malnutrition and pregnancy  - elevated legs- follow off on IVF  Pregnant and not yet delivered in third trimester  - OB service following -  [facility]  - Contl fetal monitoring per OB service Anemia  - follow  - pt has not been on Iron supplements at home  Code Status: Full Family Communication: Pt and family in room (indicate person spoken with, relationship, and if by phone, the number) Disposition Plan: Pending   Consultants:  OB  GI  Procedures:    Antibiotics:   (indicate start date, and stop date if known)  HPI/Subjective: Reports decreased cramping, continued diarrhea but stool seems more formed  Objective: Filed Vitals:   03/30/14 0430 03/30/14 1353 03/30/14 2100 03/31/14 0457  BP: 110/67 110/69 124/75 112/72  Pulse: 74 87 77 78  Temp: 98.8 F (37.1 C) 98.2 F (36.8 C) 97.5 F (36.4 C) 97.7 F (36.5 C)  TempSrc: Oral Oral Oral Oral  Resp: 18 18 16 18   Height:      Weight: 207 lb 6.4 oz (94.076 kg)   204 lb 6.4 oz (92.715 kg)  SpO2: 95% 97% 97% 96%   No intake or output data in the 24 hours ending 03/31/14 1200 Filed Weights   03/29/14 0410 03/30/14 0430 03/31/14 0457  Weight: 213 lb (96.616 kg) 207 lb 6.4 oz (94.076 kg) 204 lb 6.4 oz (92.715 kg)     Exam:   General:  Awake, in nad  Cardiovascular: regular, regular s1, s2  Respiratory: normal resp effort, no wheezing  Abdomen: soft, gravid  Musculoskeletal: perfused, no clubbing   Data Reviewed: Basic Metabolic Panel:  Recent Labs Lab 03/26/14 2036 03/26/14 2040 03/27/14 0426 03/29/14 0444 03/30/14 0840  NA 133* 133* 135* 134* 137  K 3.9 4.6 4.4 3.9 4.0  CL 98 100 101 101 101  CO2 22  --  22 23 24   GLUCOSE 102* 103* 108* 117* 123*  BUN 5* <3* 5* 3* 3*  CREATININE 0.49* 0.50 0.52 0.50 0.40*  CALCIUM 8.5  --  8.0* 8.1* 8.3*   Liver Function Tests:  Recent Labs Lab 03/26/14 2036 03/27/14 0426  AST 13 9  ALT 11 9  ALKPHOS 126* 108  BILITOT <0.2* <0.2*  PROT 6.2 5.0*  ALBUMIN 1.7* 1.3*   No results found for this basename: LIPASE, AMYLASE,  in the last 168 hours No results found for this basename: AMMONIA,  in the last 168 hours CBC:  Recent Labs Lab 03/26/14 2030 03/26/14 2036 03/26/14 2040 03/27/14 0426 03/29/14 0444  WBC  --  18.3*  --  13.0* 14.1*  NEUTROABS 14.5*  --   --  11.3*  --   HGB  --  12.0 13.3 10.4* 9.1*  HCT  --  36.0 39.0 31.4* 27.7*  MCV  --  80.5  --  82.4 81.0  PLT  --  435*  --  365 358   Cardiac Enzymes: No results found for this basename: CKTOTAL, CKMB, CKMBINDEX, TROPONINI,  in the last 168 hours BNP (last 3 results) No results found for this basename: PROBNP,  in the last 8760 hours CBG: No results found for this basename: GLUCAP,  in the last 168 hours  Recent Results (from the past 240 hour(s))  CLOSTRIDIUM DIFFICILE BY PCR     Status: None   Collection Time    03/27/14  2:58 AM      Result Value Ref Range Status   C difficile by pcr NEGATIVE  NEGATIVE Final     Studies: No results found.  Scheduled Meds: . hydrocortisone  100 mg Rectal QPC breakfast  . loperamide  2 mg Oral QID  . methylPREDNISolone (SOLU-MEDROL) injection  40 mg Intravenous Q12H  . ondansetron (ZOFRAN) IV  4 mg Intravenous 4 times  per day   Or  . ondansetron  4 mg Oral 4 times per day  . prenatal multivitamin  1 tablet Oral Q1200  . sodium chloride  3 mL Intravenous Q12H   Continuous Infusions:   Principal Problem:   Ulcerative colitis, acute Active Problems:   Anemia, iron deficiency   Pregnant and not yet delivered in third trimester   Protein-calorie malnutrition, severe   Pedal edema   Nausea alone  Time spent: 30min  Gust Eugene K  [facility] Pager [phone]. If 7PM-7AM, please contact night-coverage at [email or web address], password [facility] 03/31/2014, 12:00 PM  LOS: 5 days

## 2014-03-31 NOTE — Progress Notes (Signed)
[facility] Progress Note    Since last GI note: humira 160mg   given yesterday.  Overall yesterday a much better day. Less urgency, less bleeding.  Still nausea requiring IV meds at times.  Abd pain, cramping controlled with PO meds.   Objective: Vital signs in last 24 hours: Temp:  [97.5 F (36.4 C)-98.2 F (36.8 C)] 97.7 F (36.5 C) (07/08 0457) Pulse Rate:  [77-87] 78 (07/08 0457) Resp:  [16-18] 18 (07/08 0457) BP: (110-124)/(69-75) 112/72 mmHg (07/08 0457) SpO2:  [96 %-97 %] 96 % (07/08 0457) Weight:  [204 lb 6.4 oz (92.715 kg)] 204 lb 6.4 oz (92.715 kg) (07/08 0457) Last BM Date: 03/30/14 General: alert and oriented times 3 Heart: regular rate and rythm Abdomen: soft, non-tender, non-distended, normal bowel sounds   Lab Results:  Recent Labs  03/29/14 0444  WBC 14.1*  HGB 9.1*  PLT 358  MCV 81.0    Recent Labs  03/29/14 0444 03/30/14 0840  NA 134* 137  K 3.9 4.0  CL 101 101  CO2 23 24  GLUCOSE 117* 123*  BUN 3* 3*  CREATININE 0.50 0.40*  CALCIUM 8.1* 8.3*   Medications: Scheduled Meds: . hydrocortisone  100 mg Rectal QPC breakfast  . loperamide  2 mg Oral QID  . methylPREDNISolone (SOLU-MEDROL) injection  40 mg Intravenous Q12H  . ondansetron (ZOFRAN) IV  4 mg Intravenous 4 times per day   Or  . ondansetron  4 mg Oral 4 times per day  . prenatal multivitamin  1 tablet Oral Q1200  . sodium chloride  3 mL Intravenous Q12H   Continuous Infusions:  PRN Meds:.acetaminophen, morphine injection, oxyCODONE    Assessment/Plan: 28 y.o. female with UC, [redacted] weeks pregnant  Humira loading dose yesterday.  Will continue solumedrol 40 IV twice daily.  Clinically improving, hopefully can change to PO steroids in next 2-3 days and let her go home.    Rachael FeeJacobs, Daniel P, MD  03/31/2014, 7:29 AM [facility] Pager [phone]

## 2014-04-01 ENCOUNTER — Ambulatory Visit: Payer: BC Managed Care – PPO | Admitting: Internal Medicine

## 2014-04-01 DIAGNOSIS — K5150 Left sided colitis without complications: Secondary | ICD-10-CM

## 2014-04-01 DIAGNOSIS — D509 Iron deficiency anemia, unspecified: Secondary | ICD-10-CM

## 2014-04-01 MED ORDER — LOPERAMIDE HCL 2 MG PO CAPS
2.0000 mg | ORAL_CAPSULE | Freq: Once | ORAL | Status: AC
  Administered 2014-04-01: 2 mg via ORAL

## 2014-04-01 NOTE — Progress Notes (Signed)
[facility] Progress Note    Since last GI note: Not a very good evening, overnight for her.  More urgency, cramping.  Still overall improved and less bleeding.  Tolerating solid diet without n/vomiting.  Objective: Vital signs in last 24 hours: Temp:  [98.6 F (37 C)-99.1 F (37.3 C)] 98.7 F (37.1 C) (07/09 0518) Pulse Rate:  [79-85] 79 (07/09 0518) Resp:  [16-18] 18 (07/09 0518) BP: (108-125)/(66-71) 108/66 mmHg (07/09 0518) SpO2:  [97 %-98 %] 97 % (07/09 0518) Weight:  [201 lb 11.2 oz (91.491 kg)] 201 lb 11.2 oz (91.491 kg) (07/09 0518) Last BM Date: 04/01/14 General: alert and oriented times 3 Heart: regular rate and rythm Abdomen: soft, non-tender, non-distended, normal bowel sounds   Recent Labs  03/30/14 0840  NA 137  K 4.0  CL 101  CO2 24  GLUCOSE 123*  BUN 3*  CREATININE 0.40*  CALCIUM 8.3*    Medications: Scheduled Meds: . hydrocortisone  100 mg Rectal QPC breakfast  . loperamide  2 mg Oral QID  . methylPREDNISolone (SOLU-MEDROL) injection  40 mg Intravenous Q12H  . ondansetron (ZOFRAN) IV  4 mg Intravenous 4 times per day   Or  . ondansetron  4 mg Oral 4 times per day  . prenatal multivitamin  1 tablet Oral Q1200  . sodium chloride  3 mL Intravenous Q12H   Continuous Infusions:  PRN Meds:.acetaminophen, morphine injection, oxyCODONE    Assessment/Plan: 28 y.o. female with UC flare, [redacted] weeks pregnant  Humira initiated 2 days ago.  This will hopefully start to effect her soon.  Should not change to oral steroids yet. Bmet, CBC in AM.    Rachael FeeJacobs, [place] Sanon P, MD  04/01/2014, 10:37 AM [facility] Pager [phone]

## 2014-04-01 NOTE — Progress Notes (Signed)
[facility] PROGRESS NOTE  Deanna Willis QIO:[id number] DOB: 12/02/1985 DOA: 03/26/2014 PCP: Loney Laurence, MD  Assessment/Plan: Ulcerative colitis, acute  - on Lomotil QID  - started Humira 7/7 - pt intolerant of dairy products- cont diet per GI  - Reports increased diarrhea overnight with cramping - Cont IV steroids for now Dehydration  - stopped IVF due to pedal edema - urinating well.  Nausea  - I changed Zofran to QID routine rather than PRN- working very well  Severe protein cal malnutrition  - does not like Breeze-and cannot tolerate Ensure pudding  Pedal edema  - due to IVF, pro-cal malnutrition and pregnancy  - elevated legs- follow off on IVF  Pregnant and not yet delivered in third trimester  - OB service following -  [facility] on call [facility]  - Contl fetal monitoring per OB service Anemia  - follow  - pt has not been on Iron supplements at home  Code Status: Full Family Communication: Pt and family in room Disposition Plan: Pending   Consultants:  OB  GI  Procedures:    Antibiotics:   none  HPI/Subjective: Notes increased cramping and diarrhea overnight.  Objective: Filed Vitals:   03/31/14 0457 03/31/14 1428 03/31/14 2211 04/01/14 0518  BP: 112/72 117/69 125/71 108/66  Pulse: 78 84 85 79  Temp: 97.7 F (36.5 C) 98.6 F (37 C) 99.1 F (37.3 C) 98.7 F (37.1 C)  TempSrc: Oral Oral Oral Oral  Resp: 18  16 18   Height:      Weight: 204 lb 6.4 oz (92.715 kg)   201 lb 11.2 oz (91.491 kg)  SpO2: 96% 98% 98% 97%   No intake or output data in the 24 hours ending 04/01/14 1136 Filed Weights   03/30/14 0430 03/31/14 0457 04/01/14 0518  Weight: 207 lb 6.4 oz (94.076 kg) 204 lb 6.4 oz (92.715 kg) 201 lb 11.2 oz (91.491 kg)    Exam:   General:  Awake, in nad, ambulatory  Cardiovascular: regular, regular s1, s2  Respiratory: normal resp effort, no wheezing  Abdomen: soft, gravid, pos bs  Musculoskeletal:  perfused, no clubbing   Data Reviewed: Basic Metabolic Panel:  Recent Labs Lab 03/26/14 2036 03/26/14 2040 03/27/14 0426 03/29/14 0444 03/30/14 0840  NA 133* 133* 135* 134* 137  K 3.9 4.6 4.4 3.9 4.0  CL 98 100 101 101 101  CO2 22  --  22 23 24   GLUCOSE 102* 103* 108* 117* 123*  BUN 5* <3* 5* 3* 3*  CREATININE 0.49* 0.50 0.52 0.50 0.40*  CALCIUM 8.5  --  8.0* 8.1* 8.3*   Liver Function Tests:  Recent Labs Lab 03/26/14 2036 03/27/14 0426  AST 13 9  ALT 11 9  ALKPHOS 126* 108  BILITOT <0.2* <0.2*  PROT 6.2 5.0*  ALBUMIN 1.7* 1.3*   No results found for this basename: LIPASE, AMYLASE,  in the last 168 hours No results found for this basename: AMMONIA,  in the last 168 hours CBC:  Recent Labs Lab 03/26/14 2030 03/26/14 2036 03/26/14 2040 03/27/14 0426 03/29/14 0444  WBC  --  18.3*  --  13.0* 14.1*  NEUTROABS 14.5*  --   --  11.3*  --   HGB  --  12.0 13.3 10.4* 9.1*  HCT  --  36.0 39.0 31.4* 27.7*  MCV  --  80.5  --  82.4 81.0  PLT  --  435*  --  365 358   Cardiac Enzymes:  No results found for this basename: CKTOTAL, CKMB, CKMBINDEX, TROPONINI,  in the last 168 hours BNP (last 3 results) No results found for this basename: PROBNP,  in the last 8760 hours CBG: No results found for this basename: GLUCAP,  in the last 168 hours  Recent Results (from the past 240 hour(s))  CLOSTRIDIUM DIFFICILE BY PCR     Status: None   Collection Time    03/27/14  2:58 AM      Result Value Ref Range Status   C difficile by pcr NEGATIVE  NEGATIVE Final     Studies: No results found.  Scheduled Meds: . hydrocortisone  100 mg Rectal QPC breakfast  . loperamide  2 mg Oral QID  . methylPREDNISolone (SOLU-MEDROL) injection  40 mg Intravenous Q12H  . ondansetron (ZOFRAN) IV  4 mg Intravenous 4 times per day   Or  . ondansetron  4 mg Oral 4 times per day  . prenatal multivitamin  1 tablet Oral Q1200  . sodium chloride  3 mL Intravenous Q12H   Continuous Infusions:    Principal Problem:   Ulcerative colitis, acute Active Problems:   Anemia, iron deficiency   Pregnant and not yet delivered in third trimester   Protein-calorie malnutrition, severe   Pedal edema   Nausea alone  Time spent: 30min  CHIU, STEPHEN K  [facility] Pager [phone]. If 7PM-7AM, please contact night-coverage at [email or web address], password [email or web address] 04/01/2014, 11:36 AM  LOS: 6 days

## 2014-04-01 NOTE — Progress Notes (Signed)
NUTRITION FOLLOW-UP  Pt meets criteria for severe MALNUTRITION in the context of chronic illness as evidenced by <75% estimated energy intake with 7.3% weight loss in the past month.  DOCUMENTATION CODES Per approved criteria  -Severe malnutrition in the context of chronic illness   INTERVENTION: - Educated pt on diet therapy for pregnancy and ulcerative colitis during previous RD visit on 7/4 - Declined oral nutrition supplements - Encouraged increased meal intake as tolerated - RD to monitor plan of care   NUTRITION DIAGNOSIS: Altered GI function related to acute ulcerative colitis as evidenced by diarrhea. Ongoing.  Goal: 1. Resolution of diarrhea 2. Advance diet as tolerated to soft diet  Monitor:  Weights, labs, diarrhea, intake, diet tolerance  ASSESSMENT: Admitted with 2 weeks of severe bloody diarrhea. Hx of ulcerative colitis. Pt is pregnant at [redacted] weeks 4 days per MD. Reports 15 pound unintended weight loss in the past month.   Patient unable to tolerate any oral nutrition supplements at this time. Remains on Soft Diet. Pt reports that she is tolerating her meals well but is sticking to the blander types of foods. Also, she is choosing Gluten-Free and Dairy-Free things. Continues to decline all oral nutrition supplements.   Height: Ht Readings from Last 1 Encounters:  03/27/14 6\' 1"  (1.854 m)    Weight: Wt Readings from Last 1 Encounters:  04/01/14 201 lb 11.2 oz (91.491 kg)  Admit wt 203 lb  BMI:  Body mass index is 26.62 kg/(m^2).   Estimated Nutritional Needs: Kcal: 2300-2500 Protein: 80-95g Fluid: per MD  Skin: Intact   Diet Order: Parke SimmersBland  No intake or output data in the 24 hours ending 04/01/14 1510  Last BM: 7/9  Labs:   Recent Labs Lab 03/27/14 0426 03/29/14 0444 03/30/14 0840  NA 135* 134* 137  K 4.4 3.9 4.0  CL 101 101 101  CO2 22 23 24   BUN 5* 3* 3*  CREATININE 0.52 0.50 0.40*  CALCIUM 8.0* 8.1* 8.3*  GLUCOSE 108* 117* 123*     CBG (last 3)  No results found for this basename: GLUCAP,  in the last 72 hours  Scheduled Meds: . hydrocortisone  100 mg Rectal QPC breakfast  . loperamide  2 mg Oral QID  . methylPREDNISolone (SOLU-MEDROL) injection  40 mg Intravenous Q12H  . ondansetron (ZOFRAN) IV  4 mg Intravenous 4 times per day   Or  . ondansetron  4 mg Oral 4 times per day  . prenatal multivitamin  1 tablet Oral Q1200  . sodium chloride  3 mL Intravenous Q12H    Continuous Infusions:    Jarold MottoSamantha Nyoka Alcoser MS, RD, LDN Inpatient Registered Dietitian Pager: [phone] After-hours pager: [phone]

## 2014-04-01 NOTE — Progress Notes (Signed)
Patient visiting with family.  States she has a "rough" night with ulcerative colitis symptoms.  Denies uterine cramping or contractions, vaginal bleeding, or leaking of fluids.  Reports good fetal movement.  Recent pain medications.

## 2014-04-01 NOTE — Care Management Note (Signed)
    Page 1 of 1   04/01/2014     4:07:12 PM CARE MANAGEMENT NOTE 04/01/2014  Patient:  Laural RoesMILLER,Markala A.   Account Number:  [id number]  Date Initiated:  04/01/2014  Documentation initiated by:  Letha CapeAYLOR,Annalese Stiner  Subjective/Objective Assessment:   dx crohns dz, c diff, ulcerative colitis  admit- lives with spoiuse.     Action/Plan:   Anticipated DC Date:  04/03/2014   Anticipated DC Plan:  HOME/SELF CARE      DC Planning Services  CM consult      Choice offered to / List presented to:             Status of service:  In process, will continue to follow Medicare Important Message given?   (If response is "NO", the following Medicare IM given date fields will be blank) Date Medicare IM given:   Medicare IM given by:   Date Additional Medicare IM given:   Additional Medicare IM given by:    Discharge Disposition:    Per UR Regulation:  Reviewed for med. necessity/level of care/duration of stay  If discussed at Long Length of Stay Meetings, dates discussed:   04/01/2014    Comments:  7/915 1605 Letha Capeeborah Delberta Folts RN, BSN [phone] patient is [redacted] wks pregnant with ulcerative colitis, c diff and hx of crohns dz,  on iv steroids , started Humira , will need 1-2 more days of iv steroids, then dc home.

## 2014-04-02 ENCOUNTER — Telehealth: Payer: Self-pay

## 2014-04-02 LAB — CBC
HEMATOCRIT: 27.6 % — AB (ref 36.0–46.0)
HEMOGLOBIN: 8.8 g/dL — AB (ref 12.0–15.0)
MCH: 26.2 pg (ref 26.0–34.0)
MCHC: 31.9 g/dL (ref 30.0–36.0)
MCV: 82.1 fL (ref 78.0–100.0)
Platelets: 453 10*3/uL — ABNORMAL HIGH (ref 150–400)
RBC: 3.36 MIL/uL — ABNORMAL LOW (ref 3.87–5.11)
RDW: 15.1 % (ref 11.5–15.5)
WBC: 11.5 10*3/uL — ABNORMAL HIGH (ref 4.0–10.5)

## 2014-04-02 LAB — BASIC METABOLIC PANEL
Anion gap: 11 (ref 5–15)
BUN: 4 mg/dL — AB (ref 6–23)
CO2: 26 meq/L (ref 19–32)
CREATININE: 0.45 mg/dL — AB (ref 0.50–1.10)
Calcium: 8.5 mg/dL (ref 8.4–10.5)
Chloride: 100 mEq/L (ref 96–112)
GFR calc Af Amer: >90 mL/min (ref >90)
GFR calc non Af Amer: >90 mL/min (ref >90)
Glucose, Bld: 122 mg/dL — ABNORMAL HIGH (ref 70–99)
Potassium: 4 mEq/L (ref 3.7–5.3)
Sodium: 137 mEq/L (ref 137–147)

## 2014-04-02 MED ORDER — PREDNISONE 20 MG PO TABS
30.0000 mg | ORAL_TABLET | Freq: Two times a day (BID) | ORAL | Status: DC
  Administered 2014-04-02 – 2014-04-03 (×3): 30 mg via ORAL
  Filled 2014-04-02 (×4): qty 1

## 2014-04-02 NOTE — Progress Notes (Signed)
Pt reports no uterine cramping, no vag discharge. Baby active. NST reactive.

## 2014-04-02 NOTE — Progress Notes (Signed)
[facility] PROGRESS NOTE  Deanna A. Hyacinth MeekerMiller Willis DOB: 11/24/1985 DOA: 03/26/2014 PCP: Loney LaurenceHORVATH,MICHELLE A, MD  Assessment/Plan: Ulcerative colitis, acute  - on Lomotil QID  - started Humira 7/7 - pt intolerant of dairy products- cont diet per GI  - Clinically improving - GI recs noted. Plans to transition to PO prednisone today and if stable overnight, discharge with close GI follow up. Dehydration  - stopped IVF due to pedal edema - urinating well.  Nausea  - I changed Zofran to QID routine rather than PRN- working very well  Severe protein cal malnutrition  - does not like Breeze-and cannot tolerate Ensure pudding  Pedal edema  - due to IVF, pro-cal malnutrition and pregnancy  - elevated legs- follow off on IVF  Pregnant and not yet delivered in third trimester  - OB service following -  [facility] on call [facility]  - Contl fetal monitoring per OB service Anemia  - follow  - pt has not been on Iron supplements at home  Code Status: Full Family Communication: Pt and family in room Disposition Plan: Pending   Consultants:  OB  GI  Procedures:    Antibiotics:   none  HPI/Subjective: Improved cramping and diarrhea overnight. Overall feels better.  Objective: Filed Vitals:   04/01/14 0518 04/01/14 1432 04/01/14 2054 04/02/14 0530  BP: 108/66 106/66 113/73 103/63  Pulse: 79 79 85 67  Temp: 98.7 F (37.1 C) 97.9 F (36.6 C) 98.4 F (36.9 C) 98.1 F (36.7 C)  TempSrc: Oral Oral Oral Oral  Resp: 18 18 18 18   Height:      Weight: 201 lb 11.2 oz (91.491 kg)   197 lb 6.4 oz (89.54 kg)  SpO2: 97% 98% 97% 98%    Intake/Output Summary (Last 24 hours) at 04/02/14 1322 Last data filed at 04/02/14 0900  Gross per 24 hour  Intake    420 ml  Output      0 ml  Net    420 ml   Filed Weights   03/31/14 0457 04/01/14 0518 04/02/14 0530  Weight: 204 lb 6.4 oz (92.715 kg) 201 lb 11.2 oz (91.491 kg) 197 lb 6.4 oz (89.54 kg)     Exam:   General:  Awake, in nad, ambulatory  Cardiovascular: regular, s1, s2  Respiratory: normal resp effort, no wheezing  Abdomen: soft, gravid, pos bs  Musculoskeletal: perfused, no clubbing   Data Reviewed: Basic Metabolic Panel:  Recent Labs Lab 03/26/14 2036 03/26/14 2040 03/27/14 0426 03/29/14 0444 03/30/14 0840 04/02/14 0731  NA 133* 133* 135* 134* 137 137  K 3.9 4.6 4.4 3.9 4.0 4.0  CL 98 100 101 101 101 100  CO2 22  --  22 23 24 26   GLUCOSE 102* 103* 108* 117* 123* 122*  BUN 5* <3* 5* 3* 3* 4*  CREATININE 0.49* 0.50 0.52 0.50 0.40* 0.45*  CALCIUM 8.5  --  8.0* 8.1* 8.3* 8.5   Liver Function Tests:  Recent Labs Lab 03/26/14 2036 03/27/14 0426  AST 13 9  ALT 11 9  ALKPHOS 126* 108  BILITOT <0.2* <0.2*  PROT 6.2 5.0*  ALBUMIN 1.7* 1.3*   No results found for this basename: LIPASE, AMYLASE,  in the last 168 hours No results found for this basename: AMMONIA,  in the last 168 hours CBC:  Recent Labs Lab 03/26/14 2030 03/26/14 2036 03/26/14 2040 03/27/14 0426 03/29/14 0444 04/02/14 0731  WBC  --  18.3*  --  13.0* 14.1*  11.5*  NEUTROABS 14.5*  --   --  11.3*  --   --   HGB  --  12.0 13.3 10.4* 9.1* 8.8*  HCT  --  36.0 39.0 31.4* 27.7* 27.6*  MCV  --  80.5  --  82.4 81.0 82.1  PLT  --  435*  --  365 358 453*   Cardiac Enzymes: No results found for this basename: CKTOTAL, CKMB, CKMBINDEX, TROPONINI,  in the last 168 hours BNP (last 3 results) No results found for this basename: PROBNP,  in the last 8760 hours CBG: No results found for this basename: GLUCAP,  in the last 168 hours  Recent Results (from the past 240 hour(s))  CLOSTRIDIUM DIFFICILE BY PCR     Status: None   Collection Time    03/27/14  2:58 AM      Result Value Ref Range Status   C difficile by pcr NEGATIVE  NEGATIVE Final     Studies: No results found.  Scheduled Meds: . hydrocortisone  100 mg Rectal QPC breakfast  . loperamide  2 mg Oral QID  . ondansetron  (ZOFRAN) IV  4 mg Intravenous 4 times per day   Or  . ondansetron  4 mg Oral 4 times per day  . predniSONE  30 mg Oral BID  . prenatal multivitamin  1 tablet Oral Q1200  . sodium chloride  3 mL Intravenous Q12H   Continuous Infusions:   Principal Problem:   Ulcerative colitis, acute Active Problems:   Anemia, iron deficiency   Pregnant and not yet delivered in third trimester   Protein-calorie malnutrition, severe   Pedal edema   Nausea alone  Time spent:  Aleksi Brummet K  [facility] Pager [phone]. If 7PM-7AM, please contact night-coverage at [email or web address], password [facility] 04/02/2014, 1:22 PM  LOS: 7 days

## 2014-04-02 NOTE — Progress Notes (Signed)
[facility] Progress Note    Since last GI note: Continuing to slowly improve. Still with diarrhea and pain, being managed with oral pain meds.  Had a better night last night than the night before.  Is not on IV fluids now.  Objective: Vital signs in last 24 hours: Temp:  [97.9 F (36.6 C)-98.4 F (36.9 C)] 98.1 F (36.7 C) (07/10 0530) Pulse Rate:  [67-85] 67 (07/10 0530) Resp:  [18] 18 (07/10 0530) BP: (103-113)/(63-73) 103/63 mmHg (07/10 0530) SpO2:  [97 %-98 %] 98 % (07/10 0530) Weight:  [197 lb 6.4 oz (89.54 kg)] 197 lb 6.4 oz (89.54 kg) (07/10 0530) Last BM Date: 04/02/14 General: alert and oriented times 3 Heart: regular rate and rythm Abdomen: soft, non-tender, non-distended, normal bowel sounds   Lab Results:  Recent Labs  04/02/14 0731  WBC 11.5*  HGB 8.8*  PLT 453*  MCV 82.1    Recent Labs  04/02/14 0731  NA 137  K 4.0  CL 100  CO2 26  GLUCOSE 122*  BUN 4*  CREATININE 0.45*  CALCIUM 8.5   Medications: Scheduled Meds: . hydrocortisone  100 mg Rectal QPC breakfast  . loperamide  2 mg Oral QID  . methylPREDNISolone (SOLU-MEDROL) injection  40 mg Intravenous Q12H  . ondansetron (ZOFRAN) IV  4 mg Intravenous 4 times per day   Or  . ondansetron  4 mg Oral 4 times per day  . prenatal multivitamin  1 tablet Oral Q1200  . sodium chloride  3 mL Intravenous Q12H   Continuous Infusions:  PRN Meds:.acetaminophen, morphine injection, oxyCODONE    Assessment/Plan: 28 y.o. female UC flare  I am going to change her to oral steroids, 30mg  twice daily for now. Humira loading 3 days ago.  If she is not worse tomorrow, she can probably go home. She will need oral pain meds and twice daily antispasmodice (Levbid). She will need prednisone prescription; should not taper from 30mg  twice daily until seen in GI office. We will make sure she has appt mid to late next week.    Rachael FeeJacobs, Daniel P, MD  04/02/2014, 9:43 AM [facility] Pager  [phone]

## 2014-04-02 NOTE — Telephone Encounter (Signed)
Message copied by Donata DuffLEWIS, Arrietty Dercole L on Fri Apr 02, 2014 10:16 AM ------      Message from: Rachael FeeJACOBS, DANIEL P      Created: Fri Apr 02, 2014  9:53 AM       She will likely d/c tomorrow from [facility]. Needs rov with extender, gessner or myself mid to late next week.  Thanks       ------

## 2014-04-02 NOTE — Progress Notes (Signed)
[name] with GI office Willette Cluster(Paula Guenther NP) on Monday 7/20 at 10:30 AM. Called pt so she is aware.    Deanna MoccasinSarah Milica Gully

## 2014-04-03 MED ORDER — HYDROCORTISONE 100 MG/60ML RE ENEM: 100.0000 mg | ENEMA | Freq: Every day | RECTAL | Status: DC

## 2014-04-03 MED ORDER — METHYLPREDNISOLONE SODIUM SUCC 40 MG IJ SOLR
40.0000 mg | Freq: Two times a day (BID) | INTRAMUSCULAR | Status: DC
  Administered 2014-04-03 – 2014-04-12 (×18): 40 mg via INTRAVENOUS
  Filled 2014-04-03 (×18): qty 1

## 2014-04-03 MED ORDER — PREDNISONE 10 MG PO TABS: 30.0000 mg | ORAL_TABLET | Freq: Two times a day (BID) | ORAL | Status: DC

## 2014-04-03 MED ORDER — OXYCODONE HCL 5 MG PO TABS: 5.0000 mg | ORAL_TABLET | ORAL | Status: DC | PRN

## 2014-04-03 MED ORDER — HYOSCYAMINE SULFATE ER 0.375 MG PO TB12
0.3750 mg | ORAL_TABLET | Freq: Two times a day (BID) | ORAL | Status: DC
Start: 2014-04-03 — End: 2014-04-21

## 2014-04-03 NOTE — Progress Notes (Addendum)
Cross cover [facility] Subjective: Patient claims she had a rough night yesterday. She is not sure as to what caused worsening of her symptoms but claims she has had 1 loose BM every hour last night with more blood and mucus in the stool. This is accompanied by abdominal cramping and pain. She feels her symptoms have worsened since she has been switched to oral steroids. She had a lettuce wrap from [employer] yesterday and is not sure if that could be the offender. She also feels gluten products bother her but she has never been diagnosed with a gluten sensitivity or celiac sprue in the past.   Objective: Vital signs in last 24 hours: Temp:  [98 F (36.7 C)-98.2 F (36.8 C)] 98 F (36.7 C) (07/11 1258) Pulse Rate:  [70-80] 77 (07/11 1258) Resp:  [16-20] 16 (07/11 1258) BP: (97-111)/(58-72) 103/62 mmHg (07/11 1258) SpO2:  [97 %-98 %] 98 % (07/11 1258) Weight:  [89.223 kg (196 lb 11.2 oz)] 89.223 kg (196 lb 11.2 oz) (07/11 0434) Last BM Date: 04/02/14  Intake/Output from previous day: 07/10 0701 - 07/11 0700 In: 540 [P.O.:540] Out: -  Intake/Output this shift: Total I/O In: 250 [P.O.:250] Out: -   General appearance: alert, cooperative, appears stated age and no distress Resp: clear to auscultation bilaterally Cardio: regular rate and rhythm, S1, S2 normal, no murmur, click, rub or gallop GI: soft, fundal height at 30 weeks, diffusely tender on palpation; bowel sounds normal; no masses,  no organomegaly Extremities: edema 1 +  Lab Results:  Recent Labs  04/02/14 0731  WBC 11.5*  HGB 8.8*  HCT 27.6*  PLT 453*   BMET  Recent Labs  04/02/14 0731  NA 137  K 4.0  CL 100  CO2 26  GLUCOSE 122*  BUN 4*  CREATININE 0.45*  CALCIUM 8.5   Medications: I have reviewed the patient's current medications.  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 Will change back to IV  steroids for a couple of days. [facility]. She has been advised to follow a low fat diet for now. We will check a celiac panel with her next CBC.    LOS: 8 days   Mahnoor Mathisen 04/03/2014, 1:25 PM

## 2014-04-03 NOTE — Progress Notes (Signed)
04/03/14 [facility] when orders written.

## 2014-04-03 NOTE — Progress Notes (Addendum)
0900  Patient alert but resting in bed.  Hospitalist at bedside.  Patient reports difficult night with ulcerative colitis symptoms.  Denies vaginal bleeding, contractions, or leaking of fluids.  Reports good fetal movement.  FHR category I, no contractions noted.  Plan for discharge pending GI MD approval.  Per Hospitalist, if patient requires further hospitalization, [facility] will [facility].  [id number]  Dr. Mora ApplPinn updated on patient current medical status, fhr monitoring results, and current plan of care.  Indicates patient would be obstetrically clear for discharge at this point and would not [facility].  Contact information for Dr. Mora ApplPinn previously given to [facility], Dr. Rhona Leavenshiu.

## 2014-04-03 NOTE — Progress Notes (Signed)
[facility] PROGRESS NOTE  Deanna Willis WUJ:[id number] DOB: [birth year] DOA: 03/26/2014 PCP: Loney LaurenceHORVATH,MICHELLE A, MD  Off Service Summary: Please review the h and p dated 03/27/14. Briefly, the patient is a 27yo who initially presented as [redacted] week pregnant with diarrhea and abdominal pain. Pt has a hx of UC with a flare that started about one month prior to admit. Pt failed outpt PO prednisone. Pt had since been started on IV steroids with Humira later added on 7/7. Since adding Humira in addition to IV steroids, her UC symptoms appeared to improve slightly. A trial of transitioning to PO steroids was attempted on 7/10. Unfortunately, the patient reported increased cramping and diarrhea overnight and GI has since recommended resuming IV steroids. As the patient is now past 30 weeks into her pregnancy, which is considered high risk, the case was discussed with Dr. Mora ApplPinn, who has graciously accepted this patient to her [facility] at the [facility]. The patient will be transferred as of 7/11 with GI to continue managing her UC flare [facility].   Assessment/Plan: Ulcerative colitis, acute  - on Lomotil QID  - started Humira 7/7 - pt intolerant of dairy products- cont diet per GI  - Pt with continued cramping overnight - GI initially recommended transitioning to PO prednisone on 7/10, however, pt has noted increased cramping and diarrhea. GI has since changed pt back to IV solumedrol. Dehydration  - stopped IVF due to pedal edema - urinating well.  Nausea  - Changed Zofran to QID routine rather than PRN- working very well  Severe protein cal malnutrition  - does not like Breeze-and cannot tolerate Ensure pudding  Pedal edema  - due to IVF, pro-cal malnutrition and pregnancy  - elevated legs- follow off on IVF  Pregnant and not yet delivered in third trimester  - OB service following to monitor fetal heart tones - stable thus far - Contl fetal monitoring per OB service Anemia  -  follow  - pt has not been on Iron supplements at home  Code Status: Full Family Communication: Pt and family in room Disposition Plan: Pending  Consultants:  OB  GI  Procedures:    Antibiotics:   none  HPI/Subjective: Cramping and diarrhea appears to have worsened since transitioning to PO prednisone.  Objective: Filed Vitals:   04/02/14 1300 04/02/14 2055 04/03/14 0434 04/03/14 1258  BP: 106/68 111/72 97/58 103/62  Pulse: 85 80 70 77  Temp: 98.2 F (36.8 C) 98.1 F (36.7 C) 98.2 F (36.8 C) 98 F (36.7 C)  TempSrc: Oral Oral Oral Oral  Resp: 18 20 20 16   Height:      Weight:   196 lb 11.2 oz (89.223 kg)   SpO2: 98% 97% 98% 98%    Intake/Output Summary (Last 24 hours) at 04/03/14 1441 Last data filed at 04/03/14 0942  Gross per 24 hour  Intake    690 ml  Output      0 ml  Net    690 ml   Filed Weights   04/01/14 0518 04/02/14 0530 04/03/14 0434  Weight: 201 lb 11.2 oz (91.491 kg) 197 lb 6.4 oz (89.54 kg) 196 lb 11.2 oz (89.223 kg)    Exam:   General:  Awake, in nad, ambulatory  Cardiovascular: regular, s1, s2  Respiratory: normal resp effort, no wheezing  Abdomen: soft, gravid, pos bs  Musculoskeletal: perfused, no clubbing   Data Reviewed: Basic Metabolic Panel:  Recent Labs Lab 03/29/14 0444 03/30/14 0840 04/02/14  0731  NA 134* 137 137  K 3.9 4.0 4.0  CL 101 101 100  CO2 23 24 26   GLUCOSE 117* 123* 122*  BUN 3* 3* 4*  CREATININE 0.50 0.40* 0.45*  CALCIUM 8.1* 8.3* 8.5   Liver Function Tests: No results found for this basename: AST, ALT, ALKPHOS, BILITOT, PROT, ALBUMIN,  in the last 168 hours No results found for this basename: LIPASE, AMYLASE,  in the last 168 hours No results found for this basename: AMMONIA,  in the last 168 hours CBC:  Recent Labs Lab 03/29/14 0444 04/02/14 0731  WBC 14.1* 11.5*  HGB 9.1* 8.8*  HCT 27.7* 27.6*  MCV 81.0 82.1  PLT 358 453*   Cardiac Enzymes: No results found for this basename:  CKTOTAL, CKMB, CKMBINDEX, TROPONINI,  in the last 168 hours BNP (last 3 results) No results found for this basename: PROBNP,  in the last 8760 hours CBG: No results found for this basename: GLUCAP,  in the last 168 hours  Recent Results (from the past 240 hour(s))  CLOSTRIDIUM DIFFICILE BY PCR     Status: None   Collection Time    03/27/14  2:58 AM      Result Value Ref Range Status   C difficile by pcr NEGATIVE  NEGATIVE Final     Studies: No results found.  Scheduled Meds: . hydrocortisone  100 mg Rectal QPC breakfast  . loperamide  2 mg Oral QID  . ondansetron (ZOFRAN) IV  4 mg Intravenous 4 times per day   Or  . ondansetron  4 mg Oral 4 times per day  . predniSONE  30 mg Oral BID  . prenatal multivitamin  1 tablet Oral Q1200  . sodium chloride  3 mL Intravenous Q12H   Continuous Infusions:   Principal Problem:   Ulcerative colitis, acute Active Problems:   Anemia, iron deficiency   Pregnant and not yet delivered in third trimester   Protein-calorie malnutrition, severe   Pedal edema   Nausea alone  Time spent:  CHIU, STEPHEN K  [facility] Pager [phone]. If 7PM-7AM, please contact night-coverage at [email or web address], password [facility] 04/03/2014, 2:41 PM  LOS: 8 days

## 2014-04-04 MED ORDER — DEXTROSE-NACL 5-0.45 % IV SOLN
INTRAVENOUS | Status: DC
  Administered 2014-04-04 – 2014-04-07 (×5): via INTRAVENOUS
  Administered 2014-04-08: 75 mL/h via INTRAVENOUS
  Administered 2014-04-09 – 2014-04-11 (×5): via INTRAVENOUS

## 2014-04-04 NOTE — Progress Notes (Signed)
Name: [facility]ermily [facility]. [facility] Record Number:  [id number] Date of Birth: 09/26/1985 Date of Service: 04/04/2014  28 y.o. G1P0 2552w5d HD#9 admitted for Dehydration [276.51] Ulcerative colitis, unspecified complication [556.9].  Pt currently stable she is still having diarrhea, feels dehydrated. She denies contractions, no vaginal bleeding, no leaking of fluid. Reports good FM.  The patient's past medical history and prenatal records were reviewed.  Additional issues addressed and updated today: Patient Active Problem List   Diagnosis Date Noted  . Pedal edema 03/30/2014  . Nausea alone 03/30/2014  . Protein-calorie malnutrition, severe 03/28/2014  . Ulcerative colitis, acute 03/27/2014  . Pregnant and not yet delivered in third trimester 03/17/2014  . Anemia, iron deficiency 10/01/2013  . Anal fissure 06/02/2013  . Left sided ulcerative (chronic) colitis w/ appendiceal inflammaition 03/11/2013  . History of thrombocytopenia 03/11/2013   Family History  Problem Relation Age of Onset  . Diabetes Father   . Diabetes Paternal Grandfather   . Heart disease Paternal Grandfather   . Diabetes Maternal Grandmother   . Heart disease Maternal Grandmother   . Irritable bowel syndrome Paternal Grandmother   . Colon cancer Neg Hx   . Crohn's disease Neg Hx   . Kidney disease Neg Hx   . Liver disease Neg Hx    History   Social History  . Marital Status: Married    Spouse Name: N/A    Number of Children: N/A  . Years of Education: N/A   Occupational History  . Hairstylist    Social History Main Topics  . Smoking status: Never Smoker   . Smokeless tobacco: Never Used  . Alcohol Use: Yes     Comment: OCC.; none while pregnant  . Drug Use: No  . Sexual Activity: Yes   Other Topics Concern  . 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   Filed Vitals:   04/04/14 0752  BP: 108/59  Pulse: 74  Temp: 98 F (36.7 C)  Resp: 20     Physical  Examination:   Filed Vitals:   04/04/14 0752  BP: 108/59  Pulse: 74  Temp: 98 F (36.7 C)  Resp: 20   General appearance - alert, well appearing, and in no distress and oriented to person, place, and time Mental status - alert, oriented to person, place, and time Pelvic - normal external genitalia, vulva, vagina, cervix, uterus and adnexa, examination not indicated Abd  Soft, gravid, nontender Ex SCDs FHTs  150s, moderate variability accels no decels Toco  none  Cervix: not evaluated  No results found for this or any previous visit (from the past 24 hour(s)).  A:  HD#9  3252w5d with Ulcerative Colitis flare [facility].  P: IV hydration  Continue IV steroids per GI Immodium for diarrhea  Eyad Rochford [name]

## 2014-04-04 NOTE — Progress Notes (Signed)
Pt states that she is feeling better based on no bowel movement since 03:30 and attributes improvement to pain control. Requesting po pain med q4hrs.

## 2014-04-04 NOTE — Progress Notes (Addendum)
Cross cover [facility]-GI Subjective: Since I last evaluated the patient, she has been transferred [facility]. She continues to have 8-12 BM's per day with bloody mucoid stools. She however has been able to eat a low residue diet. She has had a lot of abdominal cramping as well. She has passed some blood clots in her stools last night. She has had mild nausea without vomiting.   Objective: Vital signs in last 24 hours: Temp:  [98 F (36.7 C)-98.3 F (36.8 C)] 98 F (36.7 C) (07/12 0752) Pulse Rate:  [74-95] 74 (07/12 0752) Resp:  [16-20] 20 (07/12 0752) BP: (103-127)/(59-78) 108/59 mmHg (07/12 0752) SpO2:  [98 %] 98 % (07/11 1258) Last BM Date: 04/02/14  Intake/Output from previous day: 07/11 0701 - 07/12 0700 In: 250 [P.O.:250] Out: -  Intake/Output this shift:   General appearance: alert, cooperative, appears stated age and mild distress Resp: clear to auscultation bilaterally Cardio: regular rate and rhythm, S1, S2 normal, no murmur, click, rub or gallop GI: soft, mild diffuse tenderness on palpation; bowel sounds normal; no masses,  no organomegaly, 30 weeks fundal height Extremities: extremities normal, atraumatic, no cyanosis or edema  Lab Results:  Recent Labs  04/02/14 0731  WBC 11.5*  HGB 8.8*  HCT 27.6*  PLT 453*   BMET  Recent Labs  04/02/14 0731  NA 137  K 4.0  CL 100  CO2 26  GLUCOSE 122*  BUN 4*  CREATININE 0.45*  CALCIUM 8.5   Medications: I have reviewed the patient's current medications.  Assessment/Plan: Acute flare of UC in a [redacted] weeks pregnant patient-received Humira recently and now on IV steroids; she may need a flexible sigmoidoscopy tomorrow. Will discuss with Dr. Arlyce DiceKaplan who [facility]. We will also need to repeat stool studies. She has stools studies done earlier this month that were all negative.   LOS: 9 days   Yash Cacciola 04/04/2014, 10:46 AM

## 2014-04-05 LAB — GLIADIN ANTIBODIES, SERUM
Gliadin IgA: 7.3 U/mL (ref <20)
Gliadin IgG: 6.9 U/mL (ref <20)

## 2014-04-05 LAB — TISSUE TRANSGLUTAMINASE, IGA: TISSUE TRANSGLUTAMINASE AB, IGA: 2.4 U/mL (ref <20)

## 2014-04-05 LAB — CLOSTRIDIUM DIFFICILE BY PCR: Toxigenic C. Difficile by PCR: NEGATIVE

## 2014-04-05 NOTE — Telephone Encounter (Signed)
Pt is currently admitted and has rov with Gunnar FusiPaula for 04/12/14

## 2014-04-05 NOTE — Progress Notes (Signed)
Antenatal Nutrition Assessment:  Pt meets criteria for severe MALNUTRITION in the context of chronic illness as evidenced by <75% estimated energy intake with 11.9% weight loss in the past month.  Of significant concern is the 11 lb weight loss since admission  Currently  30 6/[redacted] weeks gestation, with acute UC. Height  73"   Weight 192 lbs lbs  pre-pregnancy weight 218 lbs .  adm  BMI 26.8  IBW 165 lbs Total weight gain 0.lbs Weight gain goals 15-25 lbs Estimated needs: 23-2500 kcal/day, 85-95 grams protein/day, 2.5 liters fluid/day  Soft diet, gluten and lactose free appetite improved over what it was several days ago. Family and friends bring gluten free foods from outside to supplement limited menu in house. Pt is consuming 3 meals per day, plus snacks. The only beverages she is able to tolerate are water and grape juice.Pt reports that she has never lost this much weight before Pt does not tolerate Ensure or Resource Breeze  If weight loss continues a PICC line and parenteral support may need to be considered D5 1/2 NS at 75 ml/hr provides 306 Kcal/day   Nutrition related labs CMP     Component Value Date/Time   NA 137 04/02/2014 0731   K 4.0 04/02/2014 0731   CL 100 04/02/2014 0731   CO2 26 04/02/2014 0731   GLUCOSE 122* 04/02/2014 0731   BUN 4* 04/02/2014 0731   CREATININE 0.45* 04/02/2014 0731   CALCIUM 8.5 04/02/2014 0731   PROT 5.0* 03/27/2014 0426   ALBUMIN 1.3* 03/27/2014 0426   AST 9 03/27/2014 0426   ALT 9 03/27/2014 0426   ALKPHOS 108 03/27/2014 0426   BILITOT <0.2* 03/27/2014 0426   GFRNONAA >90 04/02/2014 0731   GFRAA >90 04/02/2014 0731    Nutrition Dx: .Altered GI function related to acute ulcerative colitis as evidenced by diarrhea. Ongoing.   No educational needs assessed at this time. Pt counseled on diet for pregnancy this adm.  Elisabeth CaraKatherine Coulter Oldaker M.Odis LusterEd. R.D. LDN Neonatal Nutrition Support Specialist/RD III Pager [phone]

## 2014-04-05 NOTE — Progress Notes (Signed)
Irving Burtonmily reports still feeling constant cramping and discomfort but much improved from admission. She has continued blood clots per rectum when wiping.  No other complaints.  Baby moving well.  Filed Vitals:   04/04/14 1615 04/04/14 2000 04/05/14 0829 04/05/14 0908  BP: 119/72 122/71 112/64   Pulse: 84 77 68   Temp: 98.1 F (36.7 C) 98 F (36.7 C) 98 F (36.7 C)   TempSrc: Oral Oral Oral   Resp: 20 18 18 18   Height:      Weight:      SpO2:          Lab Results  Component Value Date   WBC 11.5* 04/02/2014   HGB 8.8* 04/02/2014   HCT 27.6* 04/02/2014   MCV 82.1 04/02/2014   PLT 453* 04/02/2014    A/P HD#10 ulcerative colitis exacerbation. Mgmt per GI, possible flex sigmoidoscopy. Last NST reactive, not yet done this am.  Routine care.   Philip AspenALLAHAN, Calyssa Zobrist

## 2014-04-05 NOTE — Progress Notes (Signed)
RN spoke with Willette ClusterPaula Guenther NP about pt PO meds after 0000 tonight.  Orders given to d/c imodium after 0000, to give zofran via IV route, and to allow PO pain medication with sip of water until 0800.

## 2014-04-05 NOTE — Progress Notes (Signed)
    Progress Note   Subjective  Took a couple of steps back over weekend when transitioned to oral steroids. Still having pain, several loose stools with blood   Objective   Vital signs in last 24 hours: Temp:  [98 F (36.7 C)-98.2 F (36.8 C)] 98 F (36.7 C) (07/13 0829) Pulse Rate:  [68-84] 68 (07/13 0829) Resp:  [18-20] 18 (07/13 1000) BP: (112-122)/(64-79) 112/64 mmHg (07/13 0829) Weight:  [192 lb 1.6 oz (87.136 kg)] 192 lb 1.6 oz (87.136 kg) (07/13 1114) Last BM Date: 04/02/14 General:    white female in NAD Heart:  Regular rate and rhythm; no murmurs Lungs: Respirations even and unlabored, lungs CTA bilaterally Abdomen:  Soft, nontender and nondistended. Normal bowel sounds. Extremities:  Without edema. Neurologic:  Alert and oriented,  grossly normal neurologically. Psych:  Cooperative. Normal mood and affect.    Assessment / Plan:   1. UC flare. After transitioning from IV to PO steroids over weekend she had increased abdominal pain and bloody diarrhea. Now doing a little better since back on IV steroids. Overall feels only 25% improved since admission. Still requiring pain meds. First Humira dose was last Tuesday. Stool for c-diff negative. Given minimal improvement will proceed with sigmoidoscopy tomorrow. Patient agreeable to proceeding.    2. 30 weeks, 5 days gestation.     LOS: 10 days   Paula Guenther  04/05/2014, 11:28 AM  GI Attending Note  I have personally taken an interval history, reviewed the chart, and examined the patient.  I agree with the extender's note, impression and recommendations.  Robert D. Kaplan, MD, FACG [facility] [phone]     

## 2014-04-05 NOTE — Progress Notes (Signed)
Ur chart review completed.  

## 2014-04-06 ENCOUNTER — Encounter (HOSPITAL_COMMUNITY)
Admission: EM | Disposition: A | Discharge status: Home / Self Care | Payer: Self-pay | Source: Home / Self Care | Attending: Obstetrics & Gynecology

## 2014-04-06 ENCOUNTER — Encounter (HOSPITAL_COMMUNITY): Payer: Self-pay | Admitting: *Deleted

## 2014-04-06 HISTORY — PX: FLEXIBLE SIGMOIDOSCOPY: SHX5431

## 2014-04-06 LAB — OVA AND PARASITE EXAMINATION: SPECIAL REQUESTS: NORMAL

## 2014-04-06 LAB — RETICULIN ANTIBODIES, IGA W TITER: Reticulin Ab, IgA: NEGATIVE

## 2014-04-06 SURGERY — SIGMOIDOSCOPY, FLEXIBLE
Anesthesia: Moderate Sedation

## 2014-04-06 NOTE — Progress Notes (Signed)
28 y.o. G1P0 765w0d HD#11 admitted for Dehydration [276.51] Ulcerative colitis, unspecified complication [556.9].  Pt currently stable with no c/o except continues rectal bleeding.  Good FM.  Filed Vitals:   04/05/14 1800 04/05/14 1907 04/05/14 2127 04/05/14 2356  BP:   121/76 111/62  Pulse:   84 83  Temp:   98.8 F (37.1 C) 98.6 F (37 C)  TempSrc:   Oral Oral  Resp: 18 18 16 18   Height:      Weight:      SpO2:        Lungs CTA Cor RRR Abd  Soft, gravid, nontender Ex SCDs FHTs  Last night- 130s, good short term variability, NST R Toco  occ last night  Results for orders placed during the hospital encounter of 03/26/14 (from the past 48 hour(s))  STOOL CULTURE     Status: None   Collection Time    04/04/14  8:15 PM      Result Value Ref Range   Specimen Description STOOL     Special Requests Normal     Culture       Value: Culture reincubated for better growth     Performed at [facility]   Report Status PENDING    CLOSTRIDIUM DIFFICILE BY PCR     Status: None   Collection Time    04/04/14  8:15 PM      Result Value Ref Range   C difficile by pcr NEGATIVE  NEGATIVE   Comment: Performed at [facility]    A/P:  HD#11  7665w0d with ulcerative colitis and acute exacerbation.  Pregnancy is stable- NST this am pending. Pt for flex sig today. C Diff neg/Stool culture still pending  Kedron Uno A

## 2014-04-06 NOTE — Progress Notes (Signed)
1040  [facility] to [facility] and was told [facility].  1052  [facility] to [facility] for pre and post procedure EFM.  Patient denies uterine contractions, vaginal bleeding, or leaking of fluid.  Reports good fetal movement.  Monitors applied.

## 2014-04-06 NOTE — Progress Notes (Signed)
Sigmoidoscopy demonstrates a moderately active colitis.  Plan to continue IV steroids.  She should be on a lactose-free diet.  Will also check pre-albumin level.  I'm going to pull back on her diet to full liquids.  Should symptoms not improve over the next couple days we may have to add TPN and reduce diet further.

## 2014-04-06 NOTE — Progress Notes (Signed)
Flex sig c/w colitis. NST R after procedure.

## 2014-04-06 NOTE — Op Note (Signed)
[facility] [address] [place], [id number]   FLEXIBLE SIGMOIDOSCOPY PROCEDURE REPORT  PATIENT: Laural RoesMiller, Deanna A.  MR#: [id number] BIRTHDATE: 1986/08/13 , 27  yrs. old GENDER: Female ENDOSCOPIST: Louis Meckelobert D Trindon Dorton, MD REFERRED BY: PROCEDURE DATE:  04/06/2014 PROCEDURE:   Sigmoidoscopy with biopsy ASA CLASS:   Class II INDICATIONS:follow up for previously diagnosed left sided ulcerative colitis. MEDICATIONS:  DESCRIPTION OF PROCEDURE:   After the risks benefits and alternatives of the procedure were thoroughly explained, informed consent was obtained.  revealed no abnormalities of the rectum. The [id number] ([id number]) and [id number] ([id number])  endoscope was introduced through the anus  and advanced to the sigmoid colon , limited by No adverse events experienced.   The quality of the prep was    .  The instrument was then slowly withdrawn as the mucosa was fully examined.         COLON FINDINGS: The mucosa was diffusely erythematous and slightly edematous.  There was excess mucus and some granularity.  Changes were seen throughout the left colon as far as 25 cm from the anus. Biopsies were taken. Retroflexion was not performed.    The scope was then withdrawn from the patient and the procedure terminated.  COMPLICATIONS: There were no complications.  ENDOSCOPIC IMPRESSION: moderately active colitis  RECOMMENDATIONS: 1.  continue Solu-Medrol 2.  full liquid diet - lactose-free  REPEAT EXAM:   _______________________________ eSignedLouis Meckel:  Hanaan Gancarz D Nasier Thumm, MD 04/06/2014 11:58 AM   QM:VHQIONGECC:Michelle Henderson CloudHorvath, MD

## 2014-04-06 NOTE — H&P (View-Only) (Signed)
    Progress Note   Subjective  Took a couple of steps back over weekend when transitioned to oral steroids. Still having pain, several loose stools with blood   Objective   Vital signs in last 24 hours: Temp:  [98 F (36.7 C)-98.2 F (36.8 C)] 98 F (36.7 C) (07/13 0829) Pulse Rate:  [68-84] 68 (07/13 0829) Resp:  [18-20] 18 (07/13 1000) BP: (112-122)/(64-79) 112/64 mmHg (07/13 0829) Weight:  [192 lb 1.6 oz (87.136 kg)] 192 lb 1.6 oz (87.136 kg) (07/13 1114) Last BM Date: 04/02/14 General:    white female in NAD Heart:  Regular rate and rhythm; no murmurs Lungs: Respirations even and unlabored, lungs CTA bilaterally Abdomen:  Soft, nontender and nondistended. Normal bowel sounds. Extremities:  Without edema. Neurologic:  Alert and oriented,  grossly normal neurologically. Psych:  Cooperative. Normal mood and affect.    Assessment / Plan:   1. UC flare. After transitioning from IV to PO steroids over weekend she had increased abdominal pain and bloody diarrhea. Now doing a little better since back on IV steroids. Overall feels only 25% improved since admission. Still requiring pain meds. First Humira dose was last Tuesday. Stool for c-diff negative. Given minimal improvement will proceed with sigmoidoscopy tomorrow. Patient agreeable to proceeding.    2. 30 weeks, 5 days gestation.     LOS: 10 days   Willette Clusteraula Guenther  04/05/2014, 11:28 AM  GI Attending Note  I have personally taken an interval history, reviewed the chart, and examined the patient.  I agree with the extender's note, impression and recommendations.  Barbette Hairobert D. Arlyce DiceKaplan, MD, [facility] [phone]

## 2014-04-06 NOTE — Interval H&P Note (Signed)
History and Physical Interval Note:  04/06/2014 11:34 AM  Deanna Willis  has presented today for surgery, with the diagnosis of ulcerative colitis flare.  The various methods of treatment have been discussed with the patient and family. After consideration of risks, benefits and other options for treatment, the patient has consented to  Procedure(s) with comments: FLEXIBLE SIGMOIDOSCOPY (N/A) - patient pregnant [redacted] weeks.  as a surgical intervention .  The patient's history has been reviewed, patient examined, no change in status, stable for surgery.  I have reviewed the patient's chart and labs.  Questions were answered to the patient's satisfaction.    The recent H&P (dated **04/05/14*) was reviewed, the patient was examined and there is no change in the patients condition since that H&P was completed.   Deanna Willis  04/06/2014, 11:34 AM    Deanna Willis

## 2014-04-07 ENCOUNTER — Encounter (HOSPITAL_COMMUNITY): Payer: Self-pay | Admitting: Gastroenterology

## 2014-04-07 LAB — PREALBUMIN: PREALBUMIN: 16.4 mg/dL — AB (ref 17.0–34.0)

## 2014-04-07 NOTE — Progress Notes (Signed)
Name: [facility]. [facility] Number:  [id number] Date of Birth: [birth year] Date of Service: 04/07/2014  28 y.o. G1P0 6266w1d HD#12 admitted for Dehydration [276.51] Ulcerative colitis, unspecified complication [556.9].  Pt currently stable with ulcerative colitis. Patient notes her diarrhea is markedly improved.  She denies contractions, no vaginal bleeding, no leaking of fluid. Reports good FM.  The patient's past medical history and prenatal records were reviewed.  Additional issues addressed and updated today: Patient Active Problem List   Diagnosis Date Noted  . Pedal edema 03/30/2014  . Nausea alone 03/30/2014  . Protein-calorie malnutrition, severe 03/28/2014  . Ulcerative colitis, acute 03/27/2014  . Pregnant and not yet delivered in third trimester 03/17/2014  . Anemia, iron deficiency 10/01/2013  . Anal fissure 06/02/2013  . Left sided ulcerative (chronic) colitis w/ appendiceal inflammaition 03/11/2013  . History of thrombocytopenia 03/11/2013   Family History  Problem Relation Age of Onset  . Diabetes Father   . Diabetes Paternal Grandfather   . Heart disease Paternal Grandfather   . Diabetes Maternal Grandmother   . Heart disease Maternal Grandmother   . Irritable bowel syndrome Paternal Grandmother   . Colon cancer Neg Hx   . Crohn's disease Neg Hx   . Kidney disease Neg Hx   . Liver disease Neg Hx    History   Social History  . Marital Status: Married    Spouse Name: N/A    Number of Children: N/A  . Years of Education: N/A   Occupational History  . Hairstylist    Social History Main Topics  . Smoking status: Never Smoker   . Smokeless tobacco: Never Used  . Alcohol Use: Yes     Comment: OCC.; none while pregnant  . Drug Use: No  . Sexual Activity: Yes   Other Topics Concern  . 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   Filed Vitals:   04/07/14 0040  BP:   Pulse: 79  Temp: 98.4 F (36.9 C)  Resp: 18      Physical Examination:   Filed Vitals:   04/07/14 0040  BP:   Pulse: 79  Temp: 98.4 F (36.9 C)  Resp: 18   General appearance - alert, well appearing, and in no distress and oriented to person, place, and time Abdomen - soft, nontender, nondistended, no masses or organomegaly Pelvic - examination not indicated Extremities - peripheral pulses normal, no pedal edema, no clubbing or cyanosis, no edema, redness or tenderness in the calves or thighs, Homan's sign negative bilaterally Lungs CTA Cor RRR Abd  Soft, gravid, nontender Ex SCDs FHTs  150s, moderate variability accels no decels Toco  None  Cervix: not evaluated  Results for orders placed during the hospital encounter of 03/26/14 (from the past 24 hour(s))  PREALBUMIN     Status: Abnormal   Collection Time    04/06/14  3:10 PM      Result Value Ref Range   Prealbumin 16.4 (*) 17.0 - 34.0 mg/dL    A:  WU#98HD#12  11066w1d with ulcerative colitis flare.  P:  Patient placed on clear liquid diet by GI Continue IV steroid therapy Patient questioned today about Humira  Mariavictoria Nottingham, Sanjuana MaeWALDA [name]

## 2014-04-07 NOTE — Progress Notes (Signed)
Nutrition Follow-up Pt now with a 13 lb weight loss since admission, plus a 15 lb weight loss PTA. Severe PCM  Prealbumin not an accurate indicator of nutritional status, as is falsely evlevated with steroid therapy.  Consider advancement to solid diet soon vs PICC line and parenteral support.  Elisabeth CaraKatherine Richerd Grime M.Odis LusterEd. R.D. LDN Neonatal Nutrition Support Specialist/RD III Pager [phone]

## 2014-04-07 NOTE — Progress Notes (Signed)
     [facility] Progress Note  Subjective:  Feeling better.  Had two loose BM's with mucus and blood so far today.  Still has some abdominal pain but has been using pain medication less frequently.    Objective:  Vital signs in last 24 hours: Temp:  [98 F (36.7 C)-100.6 F (38.1 C)] 98.4 F (36.9 C) (07/15 0040) Pulse Rate:  [79-81] 79 (07/15 0906) Resp:  [16-20] 18 (07/15 0040) BP: (108-127)/(63-71) 116/63 mmHg (07/15 0906) SpO2:  [99 %] 99 % (07/14 1150) Weight:  [190 lb 11.2 oz (86.501 kg)] 190 lb 11.2 oz (86.501 kg) (07/15 0923) Last BM Date: 04/02/14 General:  Alert, Well-developed, in NAD Heart:  Regular rate and rhythm; no murmurs Pulm:  CTAB.  No W/R/R. Abdomen:  Soft.  Gravid uterus.  BS present.  Non-tender. Extremities:  Without edema. Neurologic:  Alert and  oriented x4;  grossly normal neurologically. Psych:  Alert and cooperative. Normal mood and affect.  Assessment / Plan: 1. UC flare.  Doing a little better since back on IV steroids (solumedrol 40 mg daily) and on full liquid/lactose free diet.  Still requiring pain meds but less frequently. First Humira dose was 7/7. Stool for c-diff negative.  Flex sig 7/14 showed moderately active colitis.  Pre-albumin 16.4. 2. 31 weeks, 1 day gestation.   -Continue current regimen for now.  Will need next dose of Humira on 7/21.    LOS: 12 days   ZEHR, JESSICA D.  04/07/2014, 9:53 AM  Pager number [phone]  Biopsies show changes c/w severe UC. Pt has improved over past 24 hours.  Plan to continue current regimen.

## 2014-04-08 ENCOUNTER — Ambulatory Visit (HOSPITAL_COMMUNITY)
Admission: RE | Admit: 2014-04-08 | Discharge: 2014-04-08 | Disposition: A | Discharge status: Home / Self Care | Payer: BC Managed Care – PPO | Source: Ambulatory Visit | Attending: Obstetrics and Gynecology | Admitting: Obstetrics and Gynecology

## 2014-04-08 DIAGNOSIS — O365990 Maternal care for other known or suspected poor fetal growth, unspecified trimester, not applicable or unspecified: Secondary | ICD-10-CM | POA: Insufficient documentation

## 2014-04-08 DIAGNOSIS — O365935 Maternal care for other known or suspected poor fetal growth, third trimester, fetus 5: Secondary | ICD-10-CM

## 2014-04-08 DIAGNOSIS — Z3689 Encounter for other specified antenatal screening: Secondary | ICD-10-CM

## 2014-04-08 LAB — STOOL CULTURE: Special Requests: NORMAL

## 2014-04-08 NOTE — Progress Notes (Signed)
Patient ID: Irving Burtonmily A. Hyacinth MeekerMiller, female   DOB: 30-Oct-1985, 28 y.o.   MRN: [id number]  S: Patient feeling much better this morning. Had only 3 bowel movements overnight and is starting to have some formed stool. She continues on a clear liquid diet. She endorses active fetal movement and denies vaginal bleeding, leaking of fluid or uterine contractions O:  Filed Vitals:   04/08/14 0843 04/08/14 1111 04/08/14 1141 04/08/14 1610  BP: 112/76  111/62 109/63  Pulse: 97  83 76  Temp: 98 F (36.7 C)  98.7 F (37.1 C) 98.7 F (37.1 C)  TempSrc: Oral  Oral Oral  Resp: 18  18 18   Height:      Weight:  86.183 kg (190 lb)    SpO2:        Alert and oriented x3, no apparent distress Abdomen soft, nontender, nondistended FHR:  144, 1977gm (4#6) 76%, AFI  12.6cm Homero FellersFrank Breech   A/P 1) Will have home health see if IV fluid hydration and IV steroids would be a possibility at home. 2) per GI, they would like to keep her in-house over the weekend to minimize the risk of readmission 3) FWB and growth reassuring despite 30lb weight loss

## 2014-04-08 NOTE — Progress Notes (Signed)
     [facility] Progress Note  Subjective:  Had 4 bowel movements since 4 AM but says that some are small amounts and some actually have some form to them.  Still seeing some blood and mucus when wiping but not as much in the stool.  Definitely feels improved from prior to admission.  Still with some lower abdominal pain.  Objective:  Vital signs in last 24 hours: Temp:  [97.5 F (36.4 C)-98.2 F (36.8 C)] 97.5 F (36.4 C) (07/16 0426) Pulse Rate:  [61-95] 61 (07/16 0426) Resp:  [18-20] 18 (07/16 0426) BP: (95-118)/(53-78) 95/78 mmHg (07/16 0426) Last BM Date: 04/02/14 General:  Alert, Well-developed, in NAD Heart:  Regular rate and rhythm; no murmurs Pulm:  CTAB.  No W/R/R. Abdomen:  Soft, non-distended. Normal bowel sounds.  Non-tender.  Extremities:  Without edema. Neurologic:  Alert and  oriented x4;  grossly normal neurologically. Psych:  Alert and cooperative. Normal mood and affect.  Assessment / Plan: 1. UC flare. Doing a little better since back on IV steroids (solumedrol 40 mg daily) and on full liquid/lactose free diet. Still requiring pain meds but less frequently. First Humira dose was 7/7. Stool for c-diff negative. Flex sig 7/14 showed moderately active colitis; biopsies showed severely active chronic ulcerative colitis. Pre-albumin 16.4.  2. 31 weeks, 2 days gestation.   -Continue current regimen for now. Will need next dose of Humira on 7/21.    LOS: 13 days   ZEHR, JESSICA D.  04/08/2014, 9:23 AM  Pager number [phone]  She's making slow progress.  Plan to continue full liquids only, IV steroids.

## 2014-04-08 NOTE — Progress Notes (Signed)
04/08/14, Aida Raider RNC-MNN, BSN, [phone], CM received pc from pt's RN inquiring about possibility of [facility] for pt at MD's request.  May need IVF and IV steroids at home.  Per [facility], they can provide for pt if needed.  CM met with pt to discuss.  Pt with no preference for [facility] services.  Pt would like to go home with [facility] services if MD's in agreement.  Will follow.

## 2014-04-09 ENCOUNTER — Ambulatory Visit: Payer: BC Managed Care – PPO | Admitting: Dietician

## 2014-04-09 NOTE — Progress Notes (Signed)
[facility]  Patient Status: new pt this admission  [facility] is providing the following services: HHRN and Home Infusion Pharmacy for IV hydration and Solumedrol. met with pt to discuss POC for possible home IV hydration and IV Solumedrol. Pt has h/o ulcerative Colitis and has noticed improvement per pt since starting IV steroid instead of oral.  Shared [facility] for IV hydration and supplies as well as [facility] visits.  Pt acknowledged. Per PRN pt will remain in hospital ove the weekend and not DC before Monday. Will continue to follow and support DC as needed.  If patient discharges after hours, please call [phone].   Larry Sierras 04/09/2014, 12:20 PM

## 2014-04-09 NOTE — Progress Notes (Signed)
    Progress Note   Subjective  *Decreased abdominal pain, stool frequency.  Stools are still loose but there is more solid.**   Objective  Vital signs in last 24 hours: Temp:  [98 F (36.7 C)-98.7 F (37.1 C)] 98.7 F (37.1 C) (07/17 0900) Pulse Rate:  [73-87] 87 (07/17 0900) Resp:  [18] 18 (07/17 0900) BP: (100-116)/(58-73) 100/58 mmHg (07/17 0900) Weight:  [190 lb (86.183 kg)-193 lb 6.4 oz (87.726 kg)] 193 lb 6.4 oz (87.726 kg) (07/17 0920) Last BM Date: 04/02/14 General:   Alert,  Well-developed,  white female in NAD Heart:  Regular rate and rhythm; no murmurs Abdomen:  Soft, nontender and nondistended. Normal bowel sounds, without guarding, and without rebound.   Extremities:  Without edema. Neurologic:  Alert and  oriented x4;  grossly normal neurologically. Psych:  Alert and cooperative. Normal mood and affect.  Intake/Output from previous day:   Intake/Output this shift:    Lab Results: No results found for this basename: WBC, HGB, HCT, PLT,  in the last 72 hours BMET No results found for this basename: NA, K, CL, CO2, GLUCOSE, BUN, CREATININE, CALCIUM,  in the last 72 hours LFT No results found for this basename: PROT, ALBUMIN, AST, ALT, ALKPHOS, BILITOT, BILIDIR, IBILI,  in the last 72 hours PT/INR No results found for this basename: LABPROT, INR,  in the last 72 hours Hepatitis Panel No results found for this basename: HEPBSAG, HCVAB, HEPAIGM, HEPBIGM,  in the last 72 hours  Studies/Results: [place] Ob Comp + 14 Wk  04/08/2014   OBSTETRICAL ULTRASOUND: This exam was performed within a [facility]. The OB US report was generated in the AS system, and faxed to the ordering physician.   This report is available in the [employer] PACS. See the [facility] Obstetric US report via the Image Link.     Assessment & Plan  *Slow improvement in colitic symptoms. Plan to continue current regimen. ** Principal Problem:   Ulcerative colitis, acute Active  Problems:   Anemia, iron deficiency   Pregnant and not yet delivered in third trimester   Protein-calorie malnutrition, severe   Pedal edema   Nausea alone     LOS: 14 days   Melvia HeapsRobert Barack Nicodemus  04/09/2014, 10:26 AM

## 2014-04-10 NOTE — Progress Notes (Signed)
OB/ Pt improving. Baby stable. PLAN/ per GI.

## 2014-04-10 NOTE — Progress Notes (Signed)
     [facility] Progress Note  Subjective:  Feeling better.  Took a 20 minute walk around halls last night.  Had 8 BMs yesterday but some are small amounts and had some form.  Blood is much less, mostly when wiping.  Objective:  Vital signs in last 24 hours: Temp:  [98.1 F (36.7 C)-98.7 F (37.1 C)] 98.1 F (36.7 C) (07/18 0610) Pulse Rate:  [71-91] 71 (07/18 0610) Resp:  [18-20] 18 (07/18 0610) BP: (100-124)/(58-76) 107/62 mmHg (07/18 0610) Weight:  [193 lb 6.4 oz (87.726 kg)] 193 lb 6.4 oz (87.726 kg) (07/17 0920) Last BM Date: 04/02/14 General:  Alert, Well-developed, in NAD Heart:  Regular rate and rhythm; no murmurs Pulm:  CTAB.  No W/R/R. Abdomen:  Soft, gravid uterus.  BS present.  Non-tender.  Extremities:  Without edema. Neurologic:  Alert and  oriented x4;  grossly normal neurologically. Psych:  Alert and cooperative. Normal mood and affect.  Assessment / Plan: 1. UC flare.  Slowly getting better since on IV steroids (solumedrol 40 mg BID) and on full liquid/lactose free diet.  Requiring pain meds less frequently. First Humira dose was 7/7. Stool for c-diff negative. Flex sig 7/14 showed moderately active colitis; biopsies showed severely active chronic ulcerative colitis. Pre-albumin 16.4.  2. 31 weeks, 4 days gestation.   -Continue current regimen for now. Will need next dose of Humira on 7/21.    LOS: 15 days   ZEHR, JESSICA D.  04/10/2014, 8:45 AM  Pager number [phone]  GI Attending Note  I have personally taken an interval history, reviewed the chart, and examined the patient.  Continues with slow improvement.  Plan to advance diet in a.m. to low fiber of continuing IV Solu-Medrol  Barbette Hairobert D. Arlyce DiceKaplan, MD, [facility] [phone]

## 2014-04-11 MED ORDER — LACTATED RINGERS IV SOLN
INTRAVENOUS | Status: DC
  Administered 2014-04-11 – 2014-04-12 (×2): via INTRAVENOUS

## 2014-04-11 NOTE — Progress Notes (Signed)
     [facility] Progress Note  Subjective:  Tolerated soft/low fiber diet for breakfast and so far it has sat well with her.  2 BM's overnight and so far only one this AM.  Objective:  Vital signs in last 24 hours: Temp:  [98.1 F (36.7 C)-98.3 F (36.8 C)] 98.2 F (36.8 C) (07/19 0801) Pulse Rate:  [60-85] 68 (07/19 0801) Resp:  [18-20] 18 (07/19 0801) BP: (93-117)/(50-71) 108/62 mmHg (07/19 0801) Weight:  [200 lb 11.2 oz (91.037 kg)-202 lb 0.4 oz (91.638 kg)] 200 lb 11.2 oz (91.037 kg) (07/19 0828) Last BM Date: 04/02/14 General:  Alert, Well-developed, in NAD Heart:  Regular rate and rhythm; no murmurs Pulm:  CTAB.  No W/R/R. Abdomen:  Soft, gravid uterus. Normal bowel sounds.  Mild lower abdominal TTP without R/R/G. Extremities:  Without edema. Neurologic:  Alert and  oriented x4;  grossly normal neurologically. Psych:  Alert and cooperative. Normal mood and affect.  Assessment / Plan: 1. UC flare. Slowly getting better on IV steroids (solumedrol 40 mg BID). Requiring pain meds less frequently.  Diet advanced to soft/low fiber this morning and so far she tolerated it well.  First Humira dose was 7/7. Stool for c-diff negative. Flex sig 7/14 showed moderately active colitis; biopsies showed severely active chronic ulcerative colitis. Pre-albumin 16.4.  2. 31 weeks, 5 days gestation.   -Continue current regimen for now. Will need next dose of Humira on 7/21.  Continue soft/low fiber diet for now.    LOS: 16 days   ZEHR, JESSICA D.  04/11/2014, 9:44 AM  Pager number [phone]  GI Attending Note  I have personally taken an interval history, reviewed the chart, and examined the patient. Continues to improve slowly.  Plan to switch to po steroids tomorrow if this continues.  Barbette Hairobert D. Arlyce DiceKaplan, MD, [facility] [phone]

## 2014-04-12 ENCOUNTER — Ambulatory Visit: Payer: BC Managed Care – PPO | Admitting: Nurse Practitioner

## 2014-04-12 MED ORDER — PREDNISONE 20 MG PO TABS
40.0000 mg | ORAL_TABLET | Freq: Every day | ORAL | Status: DC
  Filled 2014-04-12: qty 2

## 2014-04-12 MED ORDER — ADALIMUMAB 40 MG/0.8ML ~~LOC~~ PSKT
80.0000 mg | PREFILLED_SYRINGE | Freq: Once | SUBCUTANEOUS | Status: AC
  Administered 2014-04-13: 80 mg via SUBCUTANEOUS

## 2014-04-12 MED ORDER — PREDNISONE 20 MG PO TABS
40.0000 mg | ORAL_TABLET | Freq: Once | ORAL | Status: AC
  Administered 2014-04-12: 40 mg via ORAL
  Filled 2014-04-12: qty 2

## 2014-04-12 MED ORDER — PREDNISONE 50 MG PO TABS
60.0000 mg | ORAL_TABLET | Freq: Every day | ORAL | Status: DC
  Administered 2014-04-13 – 2014-04-14 (×2): 60 mg via ORAL
  Filled 2014-04-12 (×3): qty 1

## 2014-04-12 NOTE — Progress Notes (Signed)
     [facility] Progress Note  Subjective:  Feeling much better.  Had 3 BM's since mid-night.  Only small amounts of blood when wiping.  Walking around alot.  Tolerating soft/low fiber diet.  Objective:  Vital signs in last 24 hours: Temp:  [98 F (36.7 C)-98.6 F (37 C)] 98.4 F (36.9 C) (07/20 0841) Pulse Rate:  [71-86] 71 (07/20 0841) Resp:  [16-20] 16 (07/20 0841) BP: (105-126)/(63-83) 105/66 mmHg (07/20 0841) Last BM Date: 04/02/14 General:  Alert, Well-developed, in NAD Heart:  Regular rate and rhythm; no murmurs Pulm:  CTAB.  No W/R/R. Abdomen:  Soft, gravid uterus.  BS present.  Non-tender. Extremities:  Without edema. Neurologic:  Alert and  oriented x4;  grossly normal neurologically. Psych:  Alert and cooperative. Normal mood and affect.   Assessment / Plan: 1. UC flare. Slowly getting better on IV steroids (solumedrol 40 mg BID).  Diet advanced to soft/low fiber on 7/19 and so far she has tolerated it well. First Humira dose was 7/7. Stool for c-diff negative. Flex sig 7/14 showed moderately active colitis; biopsies showed severely active chronic ulcerative colitis. 2. 31 weeks, 6 days gestation.   -Will discontinue solumedrol and start prednisone 40 mg daily starting tomorrow, 7/21, since she already got dose of solumedrol today.  Will need next dose of Humira on 7/21 (order in computer for medication but patient will have it brought in from home). Continue soft/low fiber diet.  Will discontinue IVF's after current bag since she is taking adequate PO.   LOS: 17 days   ZEHR, JESSICA D.  04/12/2014, 9:05 AM  Pager number [phone]     Attending physician's note   I have taken an interval history, reviewed the chart and examined the patient. I agree with the Advanced Practitioner's note, impression and recommendations. Given prolonged recovery from this UC flare on Solumedrol 40 mg bid will given Prednisone 40 mg this evening and then Prednisone 60 mg  daily starting tomorrow am. Dose #2 of Humira tomorrow as planned.   Venita LickMalcolm T. Russella DarStark, MD [facility]

## 2014-04-12 NOTE — Progress Notes (Signed)
HD#17 for ulcerative colitis exacerbation Good FM, no bleeding, no leaking of fluid. NST Cat 1 Final dose of IV steroids today. Will start po steriods tomorrow, possible discharge Wed or thurs based on GI assessment.

## 2014-04-12 NOTE — Progress Notes (Signed)
[facility]  Patient Status:   [facility]  [facility] the [facility]: [facility] and [facility].  [facility] will follow pt and support DC to home if home Infusion services or [facility] is needed at that time.   If patient discharges after hours, please call [phone].   Deanna Willis 04/12/2014, 10:20 AM

## 2014-04-13 NOTE — Progress Notes (Signed)
     [facility] Progress Note  Subjective:  She is about to give herself her Humira injection.  Says that she ate some tuna salad yesterday evening and it didn't seem to sit well with her.  Had 3 BM's last night and 3 again this morning already but some just small amounts.  Had more cramping as well after eating the tuna salad.  Objective:  Vital signs in last 24 hours: Temp:  [98.2 F (36.8 C)-98.4 F (36.9 C)] 98.4 F (36.9 C) (07/21 0748) Pulse Rate:  [73-91] 73 (07/21 0748) Resp:  [18] 18 (07/21 0748) BP: (102-124)/(51-81) 106/59 mmHg (07/21 0748) Weight:  [201 lb 4.8 oz (91.309 kg)] 201 lb 4.8 oz (91.309 kg) (07/20 1051) Last BM Date: 04/02/14 General:  Alert, well-developed in NAD Heart:  Regular rate and rhythm; no murmurs Pulm:  CTAB.  No W/R/R. Abdomen:  Soft, gravid uterus.  BS present.  Mild lower abdominal TTP. Extremities:  Without edema. Neurologic:  Alert and  oriented x4;  grossly normal neurologically. Psych:  Alert and cooperative. Normal mood and affect.  Assessment / Plan: 1. UC flare. Slowly getting better.  Diet advanced to soft/low fiber on 7/19.  Second Humira dose today, 7/21. Stool for c-diff negative. Flex sig 7/14 showed moderately active colitis; biopsies showed severely active chronic ulcerative colitis.  2. 31 weeks, 7 days gestation.   -Solumedrol discontinued on 7/20.  Prednisone 60 mg daily started today and she will get her second dose of Humira this morning as well.  Continue soft/low fiber diet. -Could consider starting Bentyl two three times daily for cramping to see if this helps in place of narcotics.  It is category B but would ask that OB approve first.    LOS: 18 days   ZEHR, JESSICA D.  04/13/2014, 8:59 AM  Pager number [phone]     Attending physician's note   I have taken an interval history, reviewed the chart and examined the patient. I agree with the Advanced Practitioner's note, impression and recommendations.  Prednisone 60 mg daily started today and 2nd Humira injection given today. Add Bentyl 10 mg tid as needed for crampy abdominal pain if OK'd by OB. Could reserve Oyxcodone for pain refractory to Bentyl. We should know within the next 1-2 days if she is stable for discharge on her current UC regimen.  Venita LickMalcolm T. Russella DarStark, MD [facility]

## 2014-04-13 NOTE — Progress Notes (Signed)
Patient ID: Deanna Willis, female   DOB: 04-07-1986, 28 y.o.   MRN: [id number]  Pt in breastfeeding class downstairs

## 2014-04-14 MED ORDER — DICYCLOMINE HCL 10 MG PO CAPS: 10.0000 mg | ORAL_CAPSULE | Freq: Three times a day (TID) | ORAL | Status: DC

## 2014-04-14 MED ORDER — PREDNISONE 20 MG PO TABS: 60.0000 mg | ORAL_TABLET | Freq: Every day | ORAL | Status: DC

## 2014-04-14 MED ORDER — DICYCLOMINE HCL 10 MG PO CAPS
10.0000 mg | ORAL_CAPSULE | Freq: Three times a day (TID) | ORAL | Status: DC
  Administered 2014-04-14: 10 mg via ORAL
  Filled 2014-04-14 (×4): qty 1

## 2014-04-14 NOTE — Discharge Summary (Signed)
Physician Discharge Summary   Patient ID: Deanna Willis [id number] 28 y.o. January 21, 1986  Admit date: 03/26/2014  Discharge date and time: 04/14/2014  2:30 PM   Admitting Physician: Essie Hart, MD   Discharge Physician: Essie Hart [name]   Admission Diagnoses: Dehydration [276.51] Ulcerative colitis, unspecified complication [556.9]  Discharge Diagnoses: Ulcerative colitis flare, antepartum complication  Admission Condition: fair  Discharged Condition: good  Indication for Admission: Recurrent episodes of watery and bloody diarrhea, transfer from [facility] Course: Patient is a 28 yo G1P0 now at 32 weeks transferred from [facility] from GI service To antepartum service for management of an acute ulcerative colitis flare and dehydration due to recurrent diarrhea.  There were no obstetric problems. Patient was continued on IV hydration, daily IV steroids and IM Humira weekly.  Her diarrhea improved with immodium and the frequency of her bowel movements decreased.  GI determined  On HD 19 that the patient could be managed at home with po steroids, and pain management with bentyl.  Throughout hospital course there were no uterine contractions on the monitor and the Fetal heart tracings were all  Category I.   Consults: GI  Significant Diagnostic Studies: endoscopy: sigmoidoscopy  Treatments: IV hydration, analgesia: acetaminophen w/ codeine, steroids: prednisone and procedures: sigmoidoscopy  Discharge Exam: BP:  130/85   Pulse:  87   Temp:  98.4 F (36.9 C)   Resp:  18    General appearance - alert, well appearing, and in no distress, oriented to person, place, and time and well hydrated  Abdomen - soft, nontender, nondistended, no masses or organomegaly  Pelvic - examination not indicated  Abd Soft, gravid, nontender  Ex SCDs  FHTs 145s, moderate variability accels no decels  Toco none  Cervix: not evaluated   Disposition: 01-Home or Self  Care  Patient Instructions:    Medication List    STOP taking these medications       cefUROXime 250 MG tablet  Commonly known as:  CEFTIN     oxyCODONE-acetaminophen 5-325 MG per tablet  Commonly known as:  PERCOCET/ROXICET      TAKE these medications       acetaminophen 500 MG tablet  Commonly known as:  TYLENOL  Take 1,000 mg by mouth daily as needed (pain).     dicyclomine 10 MG capsule  Commonly known as:  BENTYL  Take 1 capsule (10 mg total) by mouth every 8 (eight) hours.     hydrocortisone 100 MG/60ML enema  Commonly known as:  CORTENEMA  Place 1 enema (100 mg total) rectally daily after breakfast.     hyoscyamine 0.375 MG 12 hr tablet  Commonly known as:  LEVBID  Take 1 tablet (0.375 mg total) by mouth 2 (two) times daily.     OVER THE COUNTER MEDICATION  Take 3 tablets by mouth at bedtime. [facility] Prenatal has iron and probiotic in it.     oxyCODONE 5 MG immediate release tablet  Commonly known as:  Oxy IR/ROXICODONE  Take 1 tablet (5 mg total) by mouth every 4 (four) hours as needed for moderate pain.     predniSONE 10 MG tablet  Commonly known as:  DELTASONE  Take 3 tablets (30 mg total) by mouth 2 (two) times daily.     predniSONE 20 MG tablet  Commonly known as:  DELTASONE  Take 3 tablets (60 mg total) by mouth daily before breakfast.     PROBIOTIC PO  Take 1 tablet  by mouth daily.     ranitidine 300 MG tablet  Commonly known as:  ZANTAC  Take 300 mg by mouth daily as needed for heartburn.       Activity: activity as tolerated Diet: low fat, low cholesterol diet Wound Care: none needed  Follow-up with GI in 1 week .and OB in 1 week  Signed: Wynonia HazardINN, Umar Patmon [name] 04/14/2014 5:55 PM

## 2014-04-14 NOTE — Progress Notes (Signed)
Name: [facility] Record Number:  [id number] Date of Birth: 02-Feb-1986 Date of Service: 04/14/2014  28 y.o. G1P0 5726w1d HD#19 admitted for Dehydration [276.51] Ulcerative colitis, unspecified complication [556.9].  Pt currently stable. In 24 hours she notes 6 bowel movements several are soft, no blood. She denies contractions, no vaginal bleeding, no leaking of fluid. Reports good FM.  The patient's past medical history and prenatal records were reviewed.  Additional issues addressed and updated today: Patient Active Problem List   Diagnosis Date Noted  . Pedal edema 03/30/2014  . Nausea alone 03/30/2014  . Protein-calorie malnutrition, severe 03/28/2014  . Ulcerative colitis, acute 03/27/2014  . Pregnant and not yet delivered in third trimester 03/17/2014  . Anemia, iron deficiency 10/01/2013  . Anal fissure 06/02/2013  . Left sided ulcerative (chronic) colitis w/ appendiceal inflammaition 03/11/2013  . History of thrombocytopenia 03/11/2013   Family History  Problem Relation Age of Onset  . Diabetes Father   . Diabetes Paternal Grandfather   . Heart disease Paternal Grandfather   . Diabetes Maternal Grandmother   . Heart disease Maternal Grandmother   . Irritable bowel syndrome Paternal Grandmother   . Colon cancer Neg Hx   . Crohn's disease Neg Hx   . Kidney disease Neg Hx   . Liver disease Neg Hx    History   Social History  . Marital Status: Married    Spouse Name: N/A    Number of Children: N/A  . Years of Education: N/A   Occupational History  . Hairstylist    Social History Main Topics  . Smoking status: Never Smoker   . Smokeless tobacco: Never Used  . Alcohol Use: Yes     Comment: OCC.; none while pregnant  . Drug Use: No  . Sexual Activity: Yes   Other Topics Concern  . 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   Filed Vitals:   04/14/14 0822  BP: 130/85  Pulse: 87  Temp: 98.4 F (36.9 C)  Resp: 18      Physical Examination:   Filed Vitals:   04/14/14 0822  BP: 130/85  Pulse: 87  Temp: 98.4 F (36.9 C)  Resp: 18   General appearance - alert, well appearing, and in no distress, oriented to person, place, and time and well hydrated Abdomen - soft, nontender, nondistended, no masses or organomegaly Pelvic - examination not indicated Abd  Soft, gravid, nontender Ex SCDs FHTs  145s, moderate variability accels no decels Toco  none  Cervix: not evaluated  No results found for this or any previous visit (from the past 24 hour(s)).  A:  HD#19  1926w1d with Ulcerative Colitis flare resolving.  P: Patient now on PO steroids She is able to take Bentyl for her GI cramping Possible discharge today vs tomorrow per GI recommendations  Deanna Willis

## 2014-04-14 NOTE — Progress Notes (Signed)
     [facility] Progress Note  Subjective:  4 BM's since 11 pm last night and two were small formed stools.  Objective:  Vital signs in last 24 hours: Temp:  [97.9 F (36.6 C)-98.4 F (36.9 C)] 98.4 F (36.9 C) (07/22 16100822) Pulse Rate:  [72-87] 87 (07/22 0822) Resp:  [18] 18 (07/22 0822) BP: (93-130)/(51-85) 130/85 mmHg (07/22 0822) Weight:  [198 lb 3.2 oz (89.903 kg)] 198 lb 3.2 oz (89.903 kg) (07/21 1623) Last BM Date: 04/02/14 General:  Alert, Well-developed, in NAD Heart:  Regular rate and rhythm; no murmurs Pulm:  CTAB.  No W/R/R. Abdomen:  Soft, gravid uterus.  BS present.  Non-tender.  Extremities:  Without edema. Neurologic:  Alert and  oriented x4;  grossly normal neurologically. Psych:  Alert and cooperative. Normal mood and affect.  Assessment / Plan: 1. UC flare. Slowly getting better. Diet advanced to soft/low fiber on 7/19. Second Humira dose 7/21. Stool for c-diff negative. Flex sig 7/14 showed moderately active colitis; biopsies showed severely active chronic ulcerative colitis.  2. 32 weeks, 1 day gestation.   -Solumedrol discontinued on 7/20. Prednisone 60 mg daily started 7/21 and she got her second dose of Humira on 7/21 as well. Continue soft/low fiber diet.  -I started her on Bentyl 10 mg TID, which she should go home with as well as Imodium prn, zofran prn, prednisone 60 mg daily, and some pain medication for break-through pain.  She has follow-up in the office with me next week (appt is in discharge information).    LOS: 19 days   ZEHR, JESSICA D.  04/14/2014, 9:20 AM  Pager number [phone]     Attending physician's note   I agree with the Advanced Practitioner's note, impression and recommendations. OK for discharge today. Follow up with Doug SouJessica Zehr, PA in 1 week. Follow up with Stan Headarl Gessner, MD will be arranged.   Venita LickMalcolm T. Russella DarStark, MD [facility]

## 2014-04-15 ENCOUNTER — Encounter: Payer: Self-pay | Admitting: Gastroenterology

## 2014-04-15 MED ORDER — PREDNISONE 20 MG PO TABS: 60.0000 mg | ORAL_TABLET | Freq: Every day | ORAL | Status: DC

## 2014-04-15 MED ORDER — ONDANSETRON HCL 4 MG PO TABS: 4.0000 mg | ORAL_TABLET | Freq: Three times a day (TID) | ORAL | Status: DC | PRN

## 2014-04-15 MED ORDER — DIPHENOXYLATE-ATROPINE 2.5-0.025 MG PO TABS: 1.0000 | ORAL_TABLET | Freq: Four times a day (QID) | ORAL | Status: DC | PRN

## 2014-04-15 NOTE — Telephone Encounter (Signed)
Patient

## 2014-04-21 ENCOUNTER — Ambulatory Visit (INDEPENDENT_AMBULATORY_CARE_PROVIDER_SITE_OTHER): Payer: BC Managed Care – PPO | Admitting: Gastroenterology

## 2014-04-21 ENCOUNTER — Telehealth: Payer: Self-pay | Admitting: Gastroenterology

## 2014-04-21 ENCOUNTER — Encounter: Payer: Self-pay | Admitting: Gastroenterology

## 2014-04-21 VITALS — BP 100/60 | HR 82 | Ht 73.0 in | Wt 203.6 lb

## 2014-04-21 DIAGNOSIS — K5190 Ulcerative colitis, unspecified, without complications: Secondary | ICD-10-CM

## 2014-04-21 MED ORDER — PREDNISONE 10 MG PO TABS: 10.0000 mg | ORAL_TABLET | Freq: Every day | ORAL | Status: DC

## 2014-04-21 NOTE — Progress Notes (Signed)
     04/21/2014 Deanna BurtonEmily A. [facility] [id number] 1986/08/25   History of Present Illness:  This is a 28 year old female who is known to Dr. Leone PayorGessner for treatment of her ulcerative colitis. She is also [redacted] weeks pregnant.  She recently had a significant flare and was admitted to the hospital for almost 3 weeks. She was very slow to recover even on IV Solu-Medrol 40 mg BID and full liquid diet. She was started on Humira during her hospitalization and received her first 2 doses while in the hospital. She is due for her third dose on August 4. She was discharged on prednisone 60 mg daily, which she has been maintained on for the past week since her discharge. She has been maintained on a soft/low fiber diet and has at least been maintaining her weight, maybe even gaining a couple of pounds. She has been taking Bentyl 10 mg 3 times daily and this has helped with her cramping. She is only use minimal amounts of pain medication. She is still using Lomotil and Zofran regularly throughout the day due to her fear of symptoms returning. She is has been working and had a full day of clients both yesterday and today. She says that she had one bowel movement overnight last night and so far only once today. Stools are having more form to them. She still sees small amounts of blood, but much less than previously. Abdominal cramping is much less.   Current Medications, Allergies, Past Medical History, Past Surgical History, Family History and Social History were reviewed in [facility] electronic medical record.   Physical Exam: BP 100/60  Pulse 82  Ht 6\' 1"  (1.854 m)  Wt 203 lb 9.6 oz (92.352 kg)  BMI 26.87 kg/m2  LMP 04/15/2013 General: Well developed white female in no acute distress Head: Normocephalic and atraumatic Eyes:  Sclerae anicteric, conjunctiva pink  Ears: Normal auditory acuity Lungs: Clear throughout to auscultation Heart: Regular rate and rhythm Abdomen: Soft, gravid uterus.  Normal bowel  sounds.  Non-tender. Musculoskeletal: Symmetrical with no gross deformities  Extremities:  Some swelling in B/L LE's. Neurological: Alert oriented x 4, grossly non-focal Psychological:  Alert and cooperative. Normal mood and affect  Assessment and Recommendations: -Ulcerative colitis:  On Humira; due for third dose on 8/4.  Is currently on prednisone 60 mg daily, but will decrease to 40 mg daily, and she will call back in one week with an update on her symptoms and if still feeling well then can decrease to 30 mg daily.  She can continue the Bentyl 10 mg every 8 hours.  Will try to decrease the frequency of the lomotil.  Will follow-up again in the office in approximately 4 weeks, but will be in touch via phone or my-chart in the interim.

## 2014-04-21 NOTE — Patient Instructions (Signed)
Decrease prednisone to 40 mg, new prescription was sent to your pharmacy.  Try to decrease your Lomotil.  You have a follow up appointment scheduled with Deanna SouJessica Zehr, PA-C for 05-28-2014 at 830 am. If you need to cancel or reschedule please call [phone]

## 2014-04-21 NOTE — Telephone Encounter (Signed)
Pharmacist wanted to clarify that patient was taking 40 mg of prednisone

## 2014-04-22 NOTE — Progress Notes (Signed)
Agree with management by Ms. Cristi LoronZehr. We also discussed.

## 2014-04-30 ENCOUNTER — Encounter: Payer: Self-pay | Admitting: Gastroenterology

## 2014-05-14 LAB — OB RESULTS CONSOLE GBS: STREP GROUP B AG: NEGATIVE

## 2014-05-25 ENCOUNTER — Encounter (HOSPITAL_COMMUNITY): Payer: Self-pay | Admitting: Anesthesiology

## 2014-05-25 ENCOUNTER — Encounter (HOSPITAL_COMMUNITY): Payer: BC Managed Care – PPO | Admitting: Anesthesiology

## 2014-05-25 ENCOUNTER — Inpatient Hospital Stay (HOSPITAL_COMMUNITY)
Admission: AD | Admit: 2014-05-25 | Discharge: 2014-05-27 | DRG: 775 | Disposition: A | Discharge status: Home / Self Care | Payer: BC Managed Care – PPO | Source: Ambulatory Visit | Attending: Obstetrics & Gynecology | Admitting: Obstetrics & Gynecology

## 2014-05-25 ENCOUNTER — Encounter (HOSPITAL_COMMUNITY): Payer: Self-pay | Admitting: *Deleted

## 2014-05-25 ENCOUNTER — Inpatient Hospital Stay (HOSPITAL_COMMUNITY): Payer: BC Managed Care – PPO | Admitting: Anesthesiology

## 2014-05-25 DIAGNOSIS — O43899 Other placental disorders, unspecified trimester: Secondary | ICD-10-CM | POA: Diagnosis present

## 2014-05-25 DIAGNOSIS — Z8759 Personal history of other complications of pregnancy, childbirth and the puerperium: Secondary | ICD-10-CM

## 2014-05-25 DIAGNOSIS — D649 Anemia, unspecified: Secondary | ICD-10-CM | POA: Diagnosis present

## 2014-05-25 DIAGNOSIS — Z833 Family history of diabetes mellitus: Secondary | ICD-10-CM

## 2014-05-25 DIAGNOSIS — O9912 Other diseases of the blood and blood-forming organs and certain disorders involving the immune mechanism complicating childbirth: Secondary | ICD-10-CM

## 2014-05-25 DIAGNOSIS — O139 Gestational [pregnancy-induced] hypertension without significant proteinuria, unspecified trimester: Secondary | ICD-10-CM | POA: Diagnosis present

## 2014-05-25 DIAGNOSIS — O9902 Anemia complicating childbirth: Secondary | ICD-10-CM | POA: Diagnosis present

## 2014-05-25 DIAGNOSIS — D689 Coagulation defect, unspecified: Secondary | ICD-10-CM | POA: Diagnosis present

## 2014-05-25 DIAGNOSIS — O4290 Premature rupture of membranes, unspecified as to length of time between rupture and onset of labor, unspecified weeks of gestation: Secondary | ICD-10-CM | POA: Diagnosis present

## 2014-05-25 DIAGNOSIS — K5190 Ulcerative colitis, unspecified, without complications: Secondary | ICD-10-CM | POA: Diagnosis present

## 2014-05-25 DIAGNOSIS — D693 Immune thrombocytopenic purpura: Secondary | ICD-10-CM | POA: Diagnosis present

## 2014-05-25 DIAGNOSIS — Z3490 Encounter for supervision of normal pregnancy, unspecified, unspecified trimester: Secondary | ICD-10-CM

## 2014-05-25 LAB — CBC
HCT: 30.7 % — ABNORMAL LOW (ref 36.0–46.0)
HEMOGLOBIN: 9.6 g/dL — AB (ref 12.0–15.0)
MCH: 25.4 pg — AB (ref 26.0–34.0)
MCHC: 31.3 g/dL (ref 30.0–36.0)
MCV: 81.2 fL (ref 78.0–100.0)
Platelets: 267 10*3/uL (ref 150–400)
RBC: 3.78 MIL/uL — AB (ref 3.87–5.11)
RDW: 16.3 % — ABNORMAL HIGH (ref 11.5–15.5)
WBC: 12.9 10*3/uL — AB (ref 4.0–10.5)

## 2014-05-25 LAB — RPR

## 2014-05-25 MED ORDER — ADALIMUMAB 40 MG/0.8ML ~~LOC~~ AJKT
40.0000 mg | AUTO-INJECTOR | Freq: Once | SUBCUTANEOUS | Status: AC
  Administered 2014-05-25: 16:00:00 via SUBCUTANEOUS

## 2014-05-25 MED ORDER — OXYTOCIN 40 UNITS IN LACTATED RINGERS INFUSION - SIMPLE MED
62.5000 mL/h | INTRAVENOUS | Status: DC
Start: 2014-05-25 — End: 2014-05-26
  Administered 2014-05-26: 62.5 mL/h via INTRAVENOUS

## 2014-05-25 MED ORDER — LACTATED RINGERS IV SOLN
500.0000 mL | INTRAVENOUS | Status: DC | PRN
  Administered 2014-05-25 (×2): 500 mL via INTRAVENOUS

## 2014-05-25 MED ORDER — LIDOCAINE HCL (PF) 1 % IJ SOLN
INTRAMUSCULAR | Status: AC
  Filled 2014-05-25: qty 30

## 2014-05-25 MED ORDER — PHENYLEPHRINE 40 MCG/ML (10ML) SYRINGE FOR IV PUSH (FOR BLOOD PRESSURE SUPPORT)
80.0000 ug | PREFILLED_SYRINGE | INTRAVENOUS | Status: DC | PRN
  Administered 2014-05-25: 80 ug via INTRAVENOUS
  Administered 2014-05-25: 200 ug via INTRAVENOUS
  Filled 2014-05-25: qty 2

## 2014-05-25 MED ORDER — OXYTOCIN BOLUS FROM INFUSION
500.0000 mL | INTRAVENOUS | Status: DC
  Administered 2014-05-25 – 2014-05-26 (×2): 500 mL via INTRAVENOUS

## 2014-05-25 MED ORDER — TERBUTALINE SULFATE 1 MG/ML IJ SOLN: 0.2500 mg | Freq: Once | INTRAMUSCULAR | Status: AC | PRN

## 2014-05-25 MED ORDER — LACTATED RINGERS IV SOLN
INTRAVENOUS | Status: DC
  Administered 2014-05-25: 19:00:00 via INTRAVENOUS

## 2014-05-25 MED ORDER — PREDNISONE 20 MG PO TABS
40.0000 mg | ORAL_TABLET | Freq: Every day | ORAL | Status: DC
  Administered 2014-05-25 – 2014-05-26 (×2): 40 mg via ORAL
  Filled 2014-05-25 (×4): qty 2

## 2014-05-25 MED ORDER — LIDOCAINE HCL (PF) 1 % IJ SOLN
30.0000 mL | INTRAMUSCULAR | Status: DC | PRN
Start: 2014-05-25 — End: 2014-05-26
  Administered 2014-05-26: 30 mL via SUBCUTANEOUS
  Filled 2014-05-25: qty 30

## 2014-05-25 MED ORDER — EPHEDRINE 5 MG/ML INJ
10.0000 mg | INTRAVENOUS | Status: DC | PRN
  Filled 2014-05-25: qty 2

## 2014-05-25 MED ORDER — BUTORPHANOL TARTRATE 1 MG/ML IJ SOLN: 1.0000 mg | INTRAMUSCULAR | Status: DC | PRN

## 2014-05-25 MED ORDER — OXYTOCIN 40 UNITS IN LACTATED RINGERS INFUSION - SIMPLE MED
1.0000 m[IU]/min | INTRAVENOUS | Status: DC
  Administered 2014-05-25: 12 m[IU]/min via INTRAVENOUS
  Administered 2014-05-25: 2 m[IU]/min via INTRAVENOUS
  Filled 2014-05-25: qty 1000

## 2014-05-25 MED ORDER — FLEET ENEMA 7-19 GM/118ML RE ENEM: 1.0000 | ENEMA | RECTAL | Status: DC | PRN

## 2014-05-25 MED ORDER — IBUPROFEN 600 MG PO TABS: 600.0000 mg | ORAL_TABLET | Freq: Four times a day (QID) | ORAL | Status: DC | PRN

## 2014-05-25 MED ORDER — DIPHENHYDRAMINE HCL 50 MG/ML IJ SOLN: 12.5000 mg | INTRAMUSCULAR | Status: DC | PRN

## 2014-05-25 MED ORDER — FENTANYL 2.5 MCG/ML BUPIVACAINE 1/10 % EPIDURAL INFUSION (WH - ANES)
14.0000 mL/h | INTRAMUSCULAR | Status: DC | PRN
  Filled 2014-05-25: qty 125

## 2014-05-25 MED ORDER — ONDANSETRON HCL 4 MG/2ML IJ SOLN
4.0000 mg | Freq: Four times a day (QID) | INTRAMUSCULAR | Status: DC | PRN
  Administered 2014-05-25: 4 mg via INTRAVENOUS
  Filled 2014-05-25: qty 2

## 2014-05-25 MED ORDER — LIDOCAINE HCL (PF) 1 % IJ SOLN
INTRAMUSCULAR | Status: DC | PRN
  Administered 2014-05-25: 12 mL

## 2014-05-25 MED ORDER — ACETAMINOPHEN 325 MG PO TABS
650.0000 mg | ORAL_TABLET | ORAL | Status: DC | PRN
  Administered 2014-05-25: 650 mg via ORAL
  Filled 2014-05-25: qty 2

## 2014-05-25 MED ORDER — OXYCODONE-ACETAMINOPHEN 5-325 MG PO TABS: 1.0000 | ORAL_TABLET | ORAL | Status: DC | PRN

## 2014-05-25 MED ORDER — FENTANYL 2.5 MCG/ML BUPIVACAINE 1/10 % EPIDURAL INFUSION (WH - ANES)
16.0000 mL/h | INTRAMUSCULAR | Status: DC | PRN
  Administered 2014-05-25: 16 mL/h via EPIDURAL

## 2014-05-25 MED ORDER — LACTATED RINGERS IV SOLN: 500.0000 mL | Freq: Once | INTRAVENOUS | Status: DC

## 2014-05-25 MED ORDER — CITRIC ACID-SODIUM CITRATE 334-500 MG/5ML PO SOLN: 30.0000 mL | ORAL | Status: DC | PRN

## 2014-05-25 MED ORDER — PHENYLEPHRINE 40 MCG/ML (10ML) SYRINGE FOR IV PUSH (FOR BLOOD PRESSURE SUPPORT)
80.0000 ug | PREFILLED_SYRINGE | INTRAVENOUS | Status: DC | PRN
  Filled 2014-05-25: qty 2
  Filled 2014-05-25: qty 10

## 2014-05-25 NOTE — Progress Notes (Signed)
Admission nutrition screen triggered for weight loss. Patients chart reviewed and assessed  for nutritional risk. Noted Hx of 16 lb weight loss during pregnancy. Pt has had a positive weight trend over the past month with a 10 lb weight gain since 7/28. Follows gluten free diet per PNR.  Patient is determined to be at low nutrition  risk.   Elisabeth Cara M.Odis Luster LDN Neonatal Nutrition Support Specialist/RD III Pager [phone]

## 2014-05-25 NOTE — Anesthesia Procedure Notes (Signed)
Epidural Patient location during procedure: OB  Preanesthetic Checklist Completed: patient identified, site marked, surgical consent, pre-op evaluation, timeout performed, IV checked, risks and benefits discussed and monitors and equipment checked  Epidural Patient position: sitting Prep: site prepped and draped and DuraPrep Patient monitoring: continuous pulse ox and blood pressure Approach: midline Injection technique: LOR air  Needle:  Needle type: Tuohy  Needle gauge: 17 G Needle length: 9 cm and 9 Needle insertion depth: 7 cm Catheter type: closed end flexible Catheter size: 19 Gauge Catheter at skin depth: 14 cm Test dose: negative  Assessment Events: blood not aspirated, injection not painful, no injection resistance, negative IV test and no paresthesia  Additional Notes Dosing of Epidural:  1st dose, through catheter ............................................Marland Kitchen  Xylocaine 50 mg  2nd dose, through catheter, after waiting 3 minutes........Marland KitchenXylocaine 70 mg    ( 1% Xylo charted as a single dose in Epic Meds for ease of charting; actual dosing was fractionated as above, for saftey's sake)  As each dose occurred, patient was free of IV sx; and patient exhibited no evidence of SA injection.  Patient is more comfortable after epidural dosed. Please see RN's note for documentation of vital signs,and FHR which are stable.  Patient reminded not to try to ambulate with numb legs, and that an RN must be present when she attempts to get up.

## 2014-05-25 NOTE — MAU Note (Signed)
Started leaking cloudy watery fluid at 1000, continued throughout day, became clear water at 2300, has had to wear pad.  Gotten heavier this morning.

## 2014-05-25 NOTE — Anesthesia Preprocedure Evaluation (Addendum)

## 2014-05-26 ENCOUNTER — Encounter (HOSPITAL_COMMUNITY): Payer: Self-pay | Admitting: *Deleted

## 2014-05-26 DIAGNOSIS — Z8759 Personal history of other complications of pregnancy, childbirth and the puerperium: Secondary | ICD-10-CM

## 2014-05-26 HISTORY — DX: Personal history of other complications of pregnancy, childbirth and the puerperium: Z87.59

## 2014-05-26 LAB — CBC
HCT: 22.6 % — ABNORMAL LOW (ref 36.0–46.0)
HEMOGLOBIN: 7.2 g/dL — AB (ref 12.0–15.0)
MCH: 25.8 pg — AB (ref 26.0–34.0)
MCHC: 31.9 g/dL (ref 30.0–36.0)
MCV: 81 fL (ref 78.0–100.0)
PLATELETS: 240 10*3/uL (ref 150–400)
RBC: 2.79 MIL/uL — ABNORMAL LOW (ref 3.87–5.11)
RDW: 16.4 % — ABNORMAL HIGH (ref 11.5–15.5)
WBC: 21.2 10*3/uL — ABNORMAL HIGH (ref 4.0–10.5)

## 2014-05-26 MED ORDER — SENNOSIDES-DOCUSATE SODIUM 8.6-50 MG PO TABS: 2.0000 | ORAL_TABLET | ORAL | Status: DC

## 2014-05-26 MED ORDER — LANOLIN HYDROUS EX OINT: TOPICAL_OINTMENT | CUTANEOUS | Status: DC | PRN

## 2014-05-26 MED ORDER — TETANUS-DIPHTH-ACELL PERTUSSIS 5-2.5-18.5 LF-MCG/0.5 IM SUSP
0.5000 mL | Freq: Once | INTRAMUSCULAR | Status: AC
  Administered 2014-05-26: 0.5 mL via INTRAMUSCULAR
  Filled 2014-05-26: qty 0.5

## 2014-05-26 MED ORDER — ONDANSETRON HCL 4 MG PO TABS: 4.0000 mg | ORAL_TABLET | ORAL | Status: DC | PRN

## 2014-05-26 MED ORDER — WITCH HAZEL-GLYCERIN EX PADS: 1.0000 "application " | MEDICATED_PAD | CUTANEOUS | Status: DC | PRN

## 2014-05-26 MED ORDER — DIPHENHYDRAMINE HCL 25 MG PO CAPS: 25.0000 mg | ORAL_CAPSULE | Freq: Four times a day (QID) | ORAL | Status: DC | PRN

## 2014-05-26 MED ORDER — OXYCODONE-ACETAMINOPHEN 5-325 MG PO TABS: 2.0000 | ORAL_TABLET | ORAL | Status: DC | PRN

## 2014-05-26 MED ORDER — SIMETHICONE 80 MG PO CHEW: 80.0000 mg | CHEWABLE_TABLET | ORAL | Status: DC | PRN

## 2014-05-26 MED ORDER — PRENATAL MULTIVITAMIN CH
1.0000 | ORAL_TABLET | Freq: Every day | ORAL | Status: DC
  Administered 2014-05-26: 1 via ORAL
  Filled 2014-05-26: qty 1

## 2014-05-26 MED ORDER — BENZOCAINE-MENTHOL 20-0.5 % EX AERO
1.0000 "application " | INHALATION_SPRAY | CUTANEOUS | Status: DC | PRN
  Filled 2014-05-26: qty 56

## 2014-05-26 MED ORDER — OXYTOCIN 40 UNITS IN LACTATED RINGERS INFUSION - SIMPLE MED: 62.5000 mL/h | INTRAVENOUS | Status: DC | PRN

## 2014-05-26 MED ORDER — ONDANSETRON HCL 4 MG/2ML IJ SOLN: 4.0000 mg | INTRAMUSCULAR | Status: DC | PRN

## 2014-05-26 MED ORDER — OXYCODONE-ACETAMINOPHEN 5-325 MG PO TABS
1.0000 | ORAL_TABLET | ORAL | Status: DC | PRN
  Administered 2014-05-26: 1 via ORAL
  Administered 2014-05-26 – 2014-05-27 (×2): 2 via ORAL
  Filled 2014-05-26 (×2): qty 2
  Filled 2014-05-26: qty 1

## 2014-05-26 MED ORDER — MISOPROSTOL 200 MCG PO TABS
ORAL_TABLET | ORAL | Status: AC
  Filled 2014-05-26: qty 5

## 2014-05-26 MED ORDER — ZOLPIDEM TARTRATE 5 MG PO TABS: 5.0000 mg | ORAL_TABLET | Freq: Every evening | ORAL | Status: DC | PRN

## 2014-05-26 MED ORDER — ACETAMINOPHEN 500 MG PO TABS
500.0000 mg | ORAL_TABLET | ORAL | Status: DC | PRN
  Administered 2014-05-26 (×2): 500 mg via ORAL
  Filled 2014-05-26 (×3): qty 1

## 2014-05-26 MED ORDER — DIBUCAINE 1 % RE OINT: 1.0000 "application " | TOPICAL_OINTMENT | RECTAL | Status: DC | PRN

## 2014-05-26 MED ORDER — OXYCODONE-ACETAMINOPHEN 5-325 MG PO TABS
1.0000 | ORAL_TABLET | ORAL | Status: DC | PRN
  Administered 2014-05-26: 1 via ORAL
  Filled 2014-05-26: qty 1

## 2014-05-26 MED ORDER — MEASLES, MUMPS & RUBELLA VAC ~~LOC~~ INJ
0.5000 mL | INJECTION | Freq: Once | SUBCUTANEOUS | Status: DC
  Filled 2014-05-26: qty 0.5

## 2014-05-26 NOTE — Consult Note (Signed)
Neonatology Note:  Attendance at Delivery:  I was asked by Dr. Pinn to attend this vacuum-assisted vaginal delivery at term due to prolonged ROM. The mother is a G1P0 A pos, GBS neg with ulcerative colitis (on prednisone and multiple other medications), ITP, PIH. ROM 38 hours prior to delivery, fluid clear. Mother afebrile during labor. CAN, clamped and cut prior to delivery of body. Infant was very floppy, pale, and with irregular respirations and no spontaneous cry at birth. HR > 100. I bulb suctioned for removal of a large amount of clear mucous, then we gave vigorous stimulation, after which the baby breathed more regularly. He maintained normal HR throughout and pinked up gradually. Tone remained poor for several minutes, but breath sounds were clear and the baby appeared alert with clear eyes (not glassy). We observed him as tone gradually improved and was normal by 8 min of life. Ap 4/8/9. Lungs clear to ausc in DR. To CN to care of Pediatrician.  Cari Vandeberg C. Johnmark Geiger, MD  

## 2014-05-26 NOTE — Lactation Note (Signed)
This note was copied from the chart of Deanna Willis. Lactation Consultation Note First time mom took BF class. Has small nipples w/no shaft. Fitted w/#16 & #20 NS. Gave shells, demonstrated wearing, hand pump given to stimulate breast. In football position baby put to breast w/NS and would not suckle. W/gloved finger suckled a few times, mostly bit down, suck training demonstrated to parents. Has recessed chin, encourage and demonstrated chin tug after latch. Hand expression demonstrated w/+ colostrum. Foley cup given to collect colostrum. Encouraged to hand pump to stimulate breast d/t baby sleepy and not interested in nursing. Encouraged to massage baby's mouth w/collected colostrum and stimulate suck training. Parents attentive and active in care instructions. Mom encouraged to feed baby 8-12 times/24 hours and with feeding cues. Mom encouraged to waken baby for feeds.  Educated about newborn behavior. Encouraged to call for assistance if needed and to verify proper latch.Mom reports + breast changes w/pregnancy. Referred to Baby and Me Book in Breastfeeding section Pg. 22-23 for position options and Proper latch demonstration.  Patient Name: Deanna Willis [facility] brochure given w/resources, support groups and LC services. Today's Date: 05/26/2014 Reason for consult: Initial assessment   Maternal Data Has patient been taught Hand Expression?: Yes Does the patient have breastfeeding experience prior to this delivery?: No  Feeding Feeding Type: Breast Fed Length of feed: 0 min  LATCH Score/Interventions Latch: Too sleepy or reluctant, no latch achieved, no sucking elicited. Intervention(s): Skin to skin;Teach feeding cues;Waking techniques Intervention(s): Adjust position;Assist with latch;Breast massage;Breast compression  Audible Swallowing: None Intervention(s): Skin to skin;Hand expression  Type of Nipple: Everted at rest and after stimulation (small, no shaft to  nipple) Intervention(s): Shells;Hand pump  Comfort (Breast/Nipple): Soft / non-tender     Hold (Positioning): Assistance needed to correctly position infant at breast and maintain latch. Intervention(s): Breastfeeding basics reviewed;Support Pillows;Position options;Skin to skin  LATCH Score: 5  Lactation Tools Discussed/Used Tools: Shells;Nipple Shields;Pump;Flanges Nipple shield size: 16;20 Flange Size: Other (comment) (21) Shell Type: Inverted Breast pump type: Manual Pump Review: Milk Storage;Setup, frequency, and cleaning Initiated by:: Peri Jefferson RN Date initiated:: 05/26/14   Consult Status Consult Status: Follow-up Date: 05/27/14 Follow-up type: In-patient    Charyl Dancer 05/26/2014, 5:53 PM

## 2014-05-26 NOTE — H&P (Signed)
Deanna Willis is a 28 y.o. female G1P0 at 24 weeks presenting for rupture of membranes clear fluid 10 am, stayed at home.  Maternal Medical History:  Reason for admission: Rupture of membranes.   Contractions: Onset was 13-24 hours ago.   Frequency: irregular.   Duration is approximately 60 seconds.   Perceived severity is mild.    Fetal activity: Perceived fetal activity is normal.   Last perceived fetal movement was within the past 12 hours.    Prenatal complications: Ulcerative colitis flares requiring hospitalization.  Prenatal Complications - Diabetes: none.    OB History   Grav Para Term Preterm Abortions TAB SAB Ect Mult Living   1              Past Medical History  Diagnosis Date  . ITP (idiopathic thrombocytopenic purpura)     last flare-up 2008; Question of associated tendency towards von Willebrand  . Menorrhagia     Associated with thrombocytopenia, 2 episodes in teenage years responded the platelet infusion  . 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 Tonsillectomy    . Colonoscopy w/ biopsies  03/24/2013  . Flexible sigmoidoscopy N/A 04/06/2014    Procedure: FLEXIBLE SIGMOIDOSCOPY;  Surgeon: Louis Meckel, MD;  Location: [facility] ENDOSCOPY;  Service: Endoscopy;  Laterality: N/A;  patient pregnant [redacted] weeks.    Family History: family history includes Diabetes in her father, maternal grandmother, and paternal grandfather; Heart disease in her maternal grandmother and paternal grandfather; Irritable bowel syndrome in her paternal grandmother. There is no history of Colon cancer, Crohn's disease, Kidney disease, or Liver disease. Social History:  reports that she has never smoked. She has never used smokeless tobacco. She reports that she drinks alcohol. She reports that she does not use illicit drugs.   Prenatal Transfer Tool  Maternal Diabetes: No Genetic Screening:  Declined Maternal Ultrasounds/Referrals: Normal Fetal Ultrasounds or other Referrals:  None, Other: normal Maternal Substance Abuse:  No Significant Maternal Medications:  Meds include: Other: h/o UC flare on prednisone  daily Humira pen   Significant Maternal Lab Results:  Lab values include: Group B Strep negative Other Comments:  During UC flare hospitalization received the following meds: Cefuroxime axetil, Colocort, dicyclomine, diphenoxylate-atropine  Review of Systems  Constitutional: Negative.  Negative for fever.  Respiratory: Negative for cough.   Cardiovascular: Negative for chest pain.  Gastrointestinal: Negative for heartburn.  Genitourinary: Negative for dysuria.  Musculoskeletal: Negative for myalgias.  Skin: Negative for rash.  Neurological: Negative for dizziness and headaches.  Endo/Heme/Allergies: Does not bruise/bleed easily.  Psychiatric/Behavioral: Negative for depression.  All other systems reviewed and are negative.   Dilation: 10 Effacement (%): 100 Station: +2 Exam by:: Dr. Mora Appl Blood pressure 119/71, pulse 86, temperature 98.1 F (36.7 C), temperature source Oral, resp. rate 20, height  (1.854 m), weight 95.709 kg (211 lb), last menstrual period 04/15/2013, SpO2 100.00%. Maternal Exam:  Uterine Assessment: Contraction strength is mild.  Contraction duration is 60 seconds. Contraction frequency is irregular.   Abdomen: Patient reports no abdominal tenderness. Fundal height is 39.   Estimated fetal weight is 3200 grams.   Fetal presentation: vertex  Introitus: Normal vulva. Normal vagina.  Ferning test: positive.  Nitrazine test: positive. Amniotic fluid character: clear.  Pelvis: adequate for delivery.   Cervix: Cervix evaluated by digital exam.   On Admission, 1cm / thick  Fetal Exam Fetal Monitor Review: Mode: hand-held doppler probe.  Baseline rate: 145.  Variability: moderate (6-25 bpm).   Pattern: accelerations present and no  decelerations.    Fetal State Assessment: Category I - tracings are normal.     Physical Exam  Nursing note and vitals reviewed. Constitutional: She is oriented to person, place, and time. She appears well-developed and well-nourished.  HENT:  Head: Normocephalic and atraumatic.  Eyes: Pupils are equal, round, and reactive to light.  Neck: Normal range of motion.  Respiratory: Effort normal.  GI: Soft.  Genitourinary: Vagina normal and uterus normal.  Musculoskeletal: Normal range of motion.  Neurological: She is alert and oriented to person, place, and time.  Skin: Skin is warm.    Prenatal labs: ABO, Rh: --/--/A POS, A POS (07/03 2032) Antibody: NEG (07/03 2032) Rubella: Equivocal (03/05 0000) RPR: NON REAC (09/01 1150)  HBsAg: Negative (03/05 0000)  HIV: Non-reactive (03/05 0000)  GBS: Negative (08/21 0000)   Assessment/Plan: 28 yo G1P0 admitted to L&D for Prolonged Premature Rupture of membranes IV hydration Continuous monitoring Epidural on demand.  Will continue home meds (humira / prednisone)   Mason Burleigh [name] 05/26/2014, 1:05 AM

## 2014-05-26 NOTE — Progress Notes (Signed)
Post Partum Day 0 Subjective: no complaints, up ad lib, voiding, tolerating PO and + flatus  Objective: Blood pressure 103/56, pulse 87, temperature 98.2 F (36.8 C), temperature source Oral, resp. rate 18, height  (1.854 m), weight 95.709 kg (211 lb), last menstrual period 04/15/2013, SpO2 98.00%, unknown if currently breastfeeding.  Physical Exam:  General: alert, cooperative and appears stated age Lochia: appropriate Uterine Fundus: firm   Recent Labs  05/25/14 1150 05/26/14 0620  HGB 9.6* 7.2*  HCT 30.7* 22.6*    Assessment/Plan: Breastfeeding Patient and husband desires neonatal circumcision. Risks, benefits, alternatives of the procedure were discussed at length. Will proceed with procedure later today.   LOS: 1 day   Deanna Willis H. 05/26/2014, 11:54 AM

## 2014-05-26 NOTE — Anesthesia Postprocedure Evaluation (Signed)
  Anesthesia Post-op Note  Patient: Deanna Willis  Procedure(s) Performed: * No procedures listed *  Patient Location: Mother/Baby  Anesthesia Type:Epidural  Level of Consciousness: awake  Airway and Oxygen Therapy: Patient Spontanous Breathing  Post-op Pain: mild  Post-op Assessment: Patient's Cardiovascular Status Stable and Respiratory Function Stable  Post-op Vital Signs: stable  Last Vitals:  Filed Vitals:   05/26/14 0754  BP: 103/56  Pulse: 87  Temp: 36.8 C  Resp: 18    Complications: No apparent anesthesia complications

## 2014-05-27 MED ORDER — OXYCODONE-ACETAMINOPHEN 5-325 MG PO TABS: 1.0000 | ORAL_TABLET | Freq: Four times a day (QID) | ORAL | Status: DC | PRN

## 2014-05-27 NOTE — Lactation Note (Signed)
This note was copied from the chart of Deanna Delicia Berens. Lactation Consultation Note  Patient Name: Deanna Willis Date: 05/27/2014 Reason for consult: Follow-up assessment Baby 39 hours of life, assessed just prior to discharge. Mom states that she has had sore nipples. There is bruising on both areolas at 12 o'clock position. Mom states baby has been latching better and soreness is decreasing. Assisted mom to latch baby in football position to left breast. Baby latches deeply, suckling rhythmically with intermittent swallows. Demonstrated to parents how to tug chin to flange lower lip. Mom reports increased comfort. Discussed engorgement prevention/treatment. Mom referred to Baby and Me booklet for EBM storage guidelines, and guidelines for number of diapers to expect by day of life. Enc mom to nurse with cues. Mom aware of OP/BFSG and LC phone line services.   Maternal Data    Feeding Feeding Type: Breast Fed (LC assessed first 10 minutes of BF. ) Length of feed: 1 min  LATCH Score/Interventions Latch: Grasps breast easily, tongue down, lips flanged, rhythmical sucking.  Audible Swallowing: A few with stimulation  Type of Nipple: Everted at rest and after stimulation  Comfort (Breast/Nipple): Soft / non-tender     Hold (Positioning): No assistance needed to correctly position infant at breast. Intervention(s): Support Pillows  LATCH Score: 9  Lactation Tools Discussed/Used     Consult Status Consult Status: PRN    Geralynn Ochs 05/27/2014, 4:04 PM

## 2014-05-27 NOTE — Discharge Summary (Signed)
Obstetric Discharge Summary Reason for Admission: rupture of membranes Prenatal Procedures: NST Intrapartum Procedures: vacuum NRFS Postpartum Procedures: none Complications-Operative and Postpartum: 1st degree, sulcus degree perineal laceration Hemoglobin  Date Value Ref Range Status  05/26/2014 7.2* 12.0 - 15.0 g/dL Final     DELTA CHECK NOTED     REPEATED TO VERIFY     HCT  Date Value Ref Range Status  05/26/2014 22.6* 36.0 - 46.0 % Final    Physical Exam:  General: alert, cooperative and appears stated age 73: appropriate Uterine Fundus: firm DVT Evaluation: No evidence of DVT seen on physical exam.  Discharge Diagnoses: Term Pregnancy-delivered  Discharge Information: Date: 05/27/2014 Activity: pelvic rest Diet: routine Medications: PNV, Ibuprofen, Iron and Percocet Condition: stable Instructions: refer to practice specific booklet Discharge to: home Follow-up Information   Follow up with PINN, Sanjuana Mae, MD In 4 weeks.   Specialty:  Obstetrics and Gynecology   Contact information:   [address] [place] [address] [phone]       Newborn Data: Live born female  Birth Weight: 6 lb 15.5 oz (3160 g) APGAR: 4, 8  Home with mother.  Marlow Baars 05/27/2014, 7:17 PM

## 2014-05-27 NOTE — Progress Notes (Signed)
Patient is eating, ambulating, voiding.  Pain control is good.  Bleeding is appropriate. Meeting all goals.  Ready for discharge today  Filed Vitals:   05/26/14 0303 05/26/14 0403 05/26/14 0754 05/27/14 0619  BP: 107/60 104/57 103/56 118/82  Pulse: 97 97 87 97  Temp: 98.5 F (36.9 C) 99.2 F (37.3 C) 98.2 F (36.8 C) 99 F (37.2 C)  TempSrc: Oral Oral Oral Oral  Resp: Height:      Weight:      SpO2: 98%       Fundus firm Perineum without swelling.  Lab Results  Component Value Date   WBC 21.2* 05/26/2014   HGB 7.2* 05/26/2014   HCT 22.6* 05/26/2014   MCV 81.0 05/26/2014   PLT 240 05/26/2014    --/--/A POS, A POS (07/03 2032)  A/P Post partum day 1. Breastfeeding well Desires circumcision.  Dr. Tenny Craw to perform today.  Routine care.  Expect d/c today.    Lambert, [facility]

## 2014-05-27 NOTE — Discharge Instructions (Signed)

## 2014-05-28 ENCOUNTER — Ambulatory Visit: Payer: BC Managed Care – PPO | Admitting: Gastroenterology

## 2014-06-02 ENCOUNTER — Encounter: Payer: Self-pay | Admitting: Gastroenterology

## 2014-06-10 ENCOUNTER — Telehealth: Payer: Self-pay | Admitting: *Deleted

## 2014-06-10 NOTE — Telephone Encounter (Signed)
','<More Detail >>       FW: Non-Urgent Medical Question    Princella Pellegrini. Zehr, Willis        Sent: Thu June 10, 2014 2:17 PM    To: Deanna Willis                   Message     How much prednisone is she taking? She may be on about 20 mg. If that is the case then let's have her taper by 5 mg weekly. She needs a refill on the medication as well. Then let's go ahead and schedule her for a visit to come back in for an office visit in the near future.        Thank you,        Jess        ----- Message -----    From: Deanna Willis    Sent: 06/10/2014 8:42 AM    To: Princella Pellegrini. Zehr, Willis    Subject: RE: Non-Urgent Medical Question         ----- Message from Deanna Willis sent at 06/10/2014 8:42 AM -----             ----- Message from Deanna Willis sent at 06/09/2014 10:09 PM -----     Deanna Willis,     So far I feel great on the prednisone. I feel completely comfortable going off of it but I understand that I need to taper off of it. Just let me know how much I need to taper down to each week. I also need a refill of it sent into [facility] when you get a chance. Hopefully with me not taking it since for 4 days won't mess anything up. I'm sorry but I forgot to check my inbox and after hearing that I shouldn't be taking it, I stopped taking it. But, I still have maybe two days left of pills so I'll start taking it again. I also need to reschedule the appointment that I missed due to going into labor. Let me know when I can come in. My schedule is wide open. Thank you!        Deanna Willis        ----- Message -----    From: Nurse Judi Cong    Sent: 06/03/14, 1:09 PM    To: Deanna Willis    Subject: RE: Non-Urgent Medical Question                    More Detail >>             FW: Non-Urgent Medical Question     Princella Pellegrini. Zehr, Willis             Sent: Thu June 03, 2014 1:05 PM     To: Deanna Willis                         Message     Congratulations on baby Gardiner Rhyme! How much prednisone is she on at this point? As she knows, it cannot be stopped abruptly, must be tapered which was our plan from previously. How is she feeling on her current dose?         Thank you,         Shanda Bumps         -----  Message -----     From: Deanna Willis     Sent: 06/03/2014 7:52 AM     To: Princella Pellegrini. Zehr, Willis     Subject: Non-Urgent Medical Question         ----- Message from Deanna Willis sent at 06/03/2014 7:52 AM -----             ----- Message from Deanna Willis sent at 06/02/2014 6:21 PM -----     Deanna Willis,         I wanted to know if I still needed to be on prednisone or if I could taper off if? The smart start nurse came by today to check on baby Gardiner Rhyme and she said that prednisone isn't good for nursing mothers. She called the pharmacy to check and they said it's not good for nursing. I just wanted to see what you thought. I will have to get an appointment scheduled sometime because I had to cancel due to having the baby. Thanks!                             Select Font Size     Small Medium Large Extra Extra Large                     Deanna Willis     06/02/2014 Patient Email     MRN: [id number]     Description: 28 year old female     Provider: Princella Pellegrini. Zehr, Willis     Department: [facility]                             Call Documentation     No notes of this type exist for this encounter.                 Encounter MyChart Messages     Read Composed From To Subject     Y 06/02/2014 6:21 PM Deanna Willis Non-Urgent Medical Question                 Routing History     Priority Sent On From To Message Type     06/03/2014 1:05 PM Shanda Bumps D. Zehr, Willis Deanna Willis Pt Advice Request     06/03/2014 7:52 AM Deanna Willis Princella Pellegrini Zehr, Willis Pt Advice Request     06/02/2014 6:21 PM Generic Mychart [facility]  Clinical Pool Pt Advice Request             Created by     Generic Mychart on 06/02/2014 06:21 PM                                         Visit Pharmacy     [facility] - [place], [place] - [address]                         ----- Message -----    From: Glendale Chard A.    Sent: 06/02/2014 6:21 PM EDT    To: Doug Sou D., Willis    Subject: Non-Urgent Medical Question  Deanna Willis,         I wanted to know if I still needed to be on prednisone or if I could taper off if? The smart start nurse came by today to check on baby Gardiner Rhyme and she said that prednisone isn't good for nursing mothers. She called the pharmacy to check and they said it's not good for nursing. I just wanted to see what you thought. I will have to get an appointment scheduled sometime because I had to cancel due to having the baby. Thanks!                        Select Font Size     Small Medium Large Extra Extra Large                [name] Description: 28 year old female  06/02/2014 Patient Email Provider: Princella Pellegrini. Rondall Allegra  MRN: [id number] Department: [facility]                 Call Documentation     No notes of this type exist for this encounter.             Encounter MyChart Messages     Read Composed From To Subject    Y 06/09/2014 10:09 PM Deanna Willis RE: Non-Urgent Medical Question    Y 06/03/2014 1:09 PM Deanna Willis Deanna Willis RE: Non-Urgent Medical Question    Y 06/02/2014 6:21 PM Deanna Willis Non-Urgent Medical Question              Routing History     Priority Sent On From To Message Type     06/10/2014 2:17 PM Shanda Bumps D. Zehr, Willis Deanna Willis Pt Advice Request     06/10/2014 8:42 AM Deanna Willis Princella Pellegrini Zehr, Willis Pt Advice Request     06/09/2014 10:09 PM Generic Mychart [facility] Clinical Pool Pt Advice Request     06/03/2014  1:05 PM Shanda Bumps D. Zehr, Willis Deanna Willis Pt Advice Request     06/03/2014 7:52 AM Deanna Willis Princella Pellegrini Zehr, Willis Pt Advice Request     06/02/2014 6:21 PM Generic Mychart [facility] Clinical Pool Pt Advice Request           Created by     Generic Mychart on 06/02/2014 06:21 PM                               Visit Pharmacy     [facility] - [place], [place] - [address]            Left a message for patient to call back.

## 2014-06-11 NOTE — Telephone Encounter (Signed)
Spoke with patient and she is on Prednisone 30 mg starting last night. She will taper by 5 mg weekly. Scheduled with Dr. Leone Payor on 06/28/14 at 2:15 PM

## 2014-06-21 ENCOUNTER — Other Ambulatory Visit: Payer: Self-pay | Admitting: Gastroenterology

## 2014-06-21 NOTE — Telephone Encounter (Signed)
Deanna Willis,  Request for refill on Prednisone. Patient has follow up with Leone Payor 06-28-2014. Do you want to refill Prednisone?

## 2014-06-28 ENCOUNTER — Other Ambulatory Visit (INDEPENDENT_AMBULATORY_CARE_PROVIDER_SITE_OTHER): Payer: BC Managed Care – PPO

## 2014-06-28 ENCOUNTER — Ambulatory Visit (INDEPENDENT_AMBULATORY_CARE_PROVIDER_SITE_OTHER): Payer: BC Managed Care – PPO | Admitting: Internal Medicine

## 2014-06-28 ENCOUNTER — Encounter: Payer: Self-pay | Admitting: Internal Medicine

## 2014-06-28 VITALS — BP 110/68 | HR 76 | Ht 73.0 in | Wt 194.8 lb

## 2014-06-28 DIAGNOSIS — K5150 Left sided colitis without complications: Secondary | ICD-10-CM

## 2014-06-28 DIAGNOSIS — D500 Iron deficiency anemia secondary to blood loss (chronic): Secondary | ICD-10-CM

## 2014-06-28 DIAGNOSIS — Z79899 Other long term (current) drug therapy: Secondary | ICD-10-CM

## 2014-06-28 DIAGNOSIS — Z7960 Long term (current) use of unspecified immunomodulators and immunosuppressants: Secondary | ICD-10-CM | POA: Insufficient documentation

## 2014-06-28 LAB — CBC WITH DIFFERENTIAL/PLATELET
Basophils Absolute: 0.1 10*3/uL (ref 0.0–0.1)
Basophils Relative: 0.7 % (ref 0.0–3.0)
EOS PCT: 1.9 % (ref 0.0–5.0)
Eosinophils Absolute: 0.2 10*3/uL (ref 0.0–0.7)
HCT: 30.6 % — ABNORMAL LOW (ref 36.0–46.0)
Hemoglobin: 9.5 g/dL — ABNORMAL LOW (ref 12.0–15.0)
Lymphocytes Relative: 39.2 % (ref 12.0–46.0)
Lymphs Abs: 3.6 10*3/uL (ref 0.7–4.0)
MCHC: 31 g/dL (ref 30.0–36.0)
MCV: 75.8 fl — AB (ref 78.0–100.0)
MONO ABS: 0.9 10*3/uL (ref 0.1–1.0)
Monocytes Relative: 9.4 % (ref 3.0–12.0)
NEUTROS PCT: 48.8 % (ref 43.0–77.0)
Neutro Abs: 4.5 10*3/uL (ref 1.4–7.7)
PLATELETS: 390 10*3/uL (ref 150.0–400.0)
RBC: 4.03 Mil/uL (ref 3.87–5.11)
RDW: 17.6 % — ABNORMAL HIGH (ref 11.5–15.5)
WBC: 9.3 10*3/uL (ref 4.0–10.5)

## 2014-06-28 MED ORDER — PREDNISONE 10 MG PO TABS: 10.0000 mg | ORAL_TABLET | Freq: Every day | ORAL | Status: DC

## 2014-06-28 NOTE — Patient Instructions (Addendum)
Your physician has requested that you go to the basement for the following lab work before leaving today: CBC  Make sure you get a flu shot.  Take your prednisone as follows: 15mg  daily the rest of this week, then 10mg  daily for 2 weeks, then 5 mg daily for 2 weeks then stop.  Today we are giving you a printed rx for the prednisone.   Follow up with us in 3 months.   I appreciate the opportunity to care for you.

## 2014-06-28 NOTE — Progress Notes (Signed)
   Subjective:    Patient ID: Irving BurtonEmily A. Hyacinth MeekerMiller, female    DOB: [birth year], 28 y.o.   MRN: [id number]  HPI Irving Burtonmily is here with her 411 month-old son, doing well without diarrhea, abdominal pain or bleeding on Humira and prednisone. Things foin well post-partum. Hgb in 7 range after delivery.  Medications, allergies, past medical history, past surgical history, family history and social history are reviewed and updated in the EMR.   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 Humira transfer to baby)   WU:[id number]:HORVATH,MICHELLE A, MD

## 2014-06-28 NOTE — Assessment & Plan Note (Addendum)
Cbc Prenatal iron used, may need more than this

## 2014-06-28 NOTE — Assessment & Plan Note (Addendum)
Clinical remission at this time Will continue Humira and taper prednisone RTC 3 months

## 2014-06-28 NOTE — Assessment & Plan Note (Addendum)
Rec influenza vaccine soon and we will need pneumonia vaccine at some point Will make sure she knows to let pediatrician know she is on Humira and consider that re: vaccines and breast-feeding (? Humira transfer to baby)

## 2014-06-29 NOTE — Progress Notes (Signed)
Quick Note:  Hgb better but she needs at least 1 ferrous sulfate 325 mg daily Also let her know to tell pediatrician she is on Humira and breast-feeding (I believe) in case that impacts her son's vaccinations ______

## 2014-06-30 ENCOUNTER — Other Ambulatory Visit: Payer: Self-pay

## 2014-06-30 MED ORDER — FERROUS SULFATE 325 (65 FE) MG PO TABS: 325.0000 mg | ORAL_TABLET | Freq: Every day | ORAL | Status: DC

## 2014-07-26 ENCOUNTER — Encounter: Payer: Self-pay | Admitting: Internal Medicine

## 2015-01-26 ENCOUNTER — Ambulatory Visit (INDEPENDENT_AMBULATORY_CARE_PROVIDER_SITE_OTHER): Payer: BLUE CROSS/BLUE SHIELD | Admitting: Internal Medicine

## 2015-01-26 ENCOUNTER — Other Ambulatory Visit (INDEPENDENT_AMBULATORY_CARE_PROVIDER_SITE_OTHER): Payer: BLUE CROSS/BLUE SHIELD

## 2015-01-26 ENCOUNTER — Encounter: Payer: Self-pay | Admitting: Internal Medicine

## 2015-01-26 VITALS — BP 100/70 | HR 60 | Ht 73.0 in | Wt 183.6 lb

## 2015-01-26 DIAGNOSIS — R203 Hyperesthesia: Secondary | ICD-10-CM

## 2015-01-26 DIAGNOSIS — Z7960 Long term (current) use of unspecified immunomodulators and immunosuppressants: Secondary | ICD-10-CM

## 2015-01-26 DIAGNOSIS — D500 Iron deficiency anemia secondary to blood loss (chronic): Secondary | ICD-10-CM

## 2015-01-26 DIAGNOSIS — Z79899 Other long term (current) drug therapy: Secondary | ICD-10-CM | POA: Diagnosis not present

## 2015-01-26 DIAGNOSIS — K5150 Left sided colitis without complications: Secondary | ICD-10-CM

## 2015-01-26 LAB — COMPREHENSIVE METABOLIC PANEL
ALT: 12 U/L (ref 0–35)
AST: 15 U/L (ref 0–37)
Albumin: 4 g/dL (ref 3.5–5.2)
Alkaline Phosphatase: 82 U/L (ref 39–117)
BILIRUBIN TOTAL: 0.5 mg/dL (ref 0.2–1.2)
BUN: 11 mg/dL (ref 6–23)
CHLORIDE: 108 meq/L (ref 96–112)
CO2: 28 mEq/L (ref 19–32)
Calcium: 9.5 mg/dL (ref 8.4–10.5)
Creatinine, Ser: 0.66 mg/dL (ref 0.40–1.20)
GFR: 114.57 mL/min (ref >60.00)
GLUCOSE: 82 mg/dL (ref 70–99)
Potassium: 4 mEq/L (ref 3.5–5.1)
Sodium: 139 mEq/L (ref 135–145)
Total Protein: 7.4 g/dL (ref 6.0–8.3)

## 2015-01-26 LAB — CBC WITH DIFFERENTIAL/PLATELET
BASOS ABS: 0 10*3/uL (ref 0.0–0.1)
Basophils Relative: 0.6 % (ref 0.0–3.0)
EOS PCT: 4.1 % (ref 0.0–5.0)
Eosinophils Absolute: 0.3 10*3/uL (ref 0.0–0.7)
HCT: 35.7 % — ABNORMAL LOW (ref 36.0–46.0)
HEMOGLOBIN: 12.1 g/dL (ref 12.0–15.0)
LYMPHS ABS: 3.5 10*3/uL (ref 0.7–4.0)
Lymphocytes Relative: 53.2 % — ABNORMAL HIGH (ref 12.0–46.0)
MCHC: 33.8 g/dL (ref 30.0–36.0)
MCV: 83.3 fl (ref 78.0–100.0)
MONOS PCT: 6.7 % (ref 3.0–12.0)
Monocytes Absolute: 0.4 10*3/uL (ref 0.1–1.0)
NEUTROS PCT: 35.4 % — AB (ref 43.0–77.0)
Neutro Abs: 2.3 10*3/uL (ref 1.4–7.7)
PLATELETS: 243 10*3/uL (ref 150.0–400.0)
RBC: 4.29 Mil/uL (ref 3.87–5.11)
RDW: 15.8 % — AB (ref 11.5–15.5)
WBC: 6.6 10*3/uL (ref 4.0–10.5)

## 2015-01-26 LAB — VITAMIN D 25 HYDROXY (VIT D DEFICIENCY, FRACTURES): VITD: 15.91 ng/mL — AB (ref 30.00–100.00)

## 2015-01-26 NOTE — Assessment & Plan Note (Addendum)
OK PPD needed

## 2015-01-26 NOTE — Assessment & Plan Note (Addendum)
Continue Humira milligrams every other week. Lab rechecked today with CBC CMETgo ahead and do her PPD today. Return to me in 6 months or so

## 2015-01-26 NOTE — Patient Instructions (Addendum)
Your physician has requested that you go to the basement for the lab work before leaving today.  Today you have been given a PPD, please come back to have it read on Friday around this time.   We have sent the following medications to your pharmacy for you to pick up at your convenience: humira  ([facility] informed to have her speciality pharmacy send us a request)  (Dr. Leone PayorGessner said we can refill it for a year)   If your left arm skin sensation worsens call us back.   Please follow up with Dr. Leone PayorGessner in 6 months.  Call us in October for this appointment.   I appreciate the opportunity to care for you. Stan Headarl Gessner, M.D., [facility]

## 2015-01-26 NOTE — Assessment & Plan Note (Signed)
CBC

## 2015-01-26 NOTE — Assessment & Plan Note (Addendum)
I don't see any particular abnormality of the skin in this area. Will monitor the symptoms. She wondered if they could be related to Humira, seems unlikely to me she does not give injection here she uses her thighs for that. If she has persistent problems, Humira can affect the nervous system so I would refer her to neurology for further testing if that were the case. She does have some neck pain at times but no weakness or other radicular symptoms, as I think cervical spine issues could cause some effect I suppose.

## 2015-01-26 NOTE — Progress Notes (Signed)
   Subjective:    Patient ID: Deanna Willis, female    DOB: May 19, 1986, 29 y.o.   MRN: [id number] Chief Complaint is follow-up ulcerative colitis HPI Deanna Burtonmily returns doing very well without diarrhea or bleeding. Energy level is okay. Her son is 298 months old now and is just starting to pull up to stand. She is having some hyperesthesia sensations in her left arm. If it's cold or something touches it it feels stimulated and sensitive. She will sometimes see piloerection associated with this. She works as a Social workerhair stylist and does have some neck pain but no weakness in her arms her hands or anything like that. She was last seen in October she was still anemic at that time. Medications, allergies, past medical history, past surgical history, family history and social history are reviewed and updated in the EMR.  Review of Systems As above    Objective:   Physical Exam @BP  100/70 mmHg  Pulse 60  Ht 6\' 1"  (1.854 m)  Wt 183 lb 9.6 oz (83.28 kg)  BMI 24.23 kg/m2  Breastfeeding? Yes@  General:  NAD Eyes:   anicteric Lungs:  clear Heart:: S1S2 no rubs, murmurs or gallops Abdomen:  soft and nontender, BS+ Ext:   no edema, cyanosis or clubbing left arm looks normal   I reviewed October 2015 labs that are in the EMR and are last GI note from that time.    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 Lab rechecked today with CBC CMETgo ahead and do her PPD today. Return to me in 6 months or so   Iron deficiency anemia due to chronic blood loss CBC   Long-term use of immunosuppressant medication - Humira OK PPD needed    Hyperesthesia - left arm I don't see any particular abnormality of the skin in this area. Will monitor the symptoms. She wondered if they could be related to Humira, seems unlikely to me she does not give injection here she uses her thighs for that. If she has persistent problems,  Humira can affect the nervous system so I would refer her to neurology for further testing if that were the case. She does have some neck pain at times but no weakness or other radicular symptoms, as I think cervical spine issues could cause some effect I suppose.

## 2015-01-28 ENCOUNTER — Encounter: Payer: Self-pay | Admitting: Internal Medicine

## 2015-01-28 DIAGNOSIS — E559 Vitamin D deficiency, unspecified: Secondary | ICD-10-CM | POA: Insufficient documentation

## 2015-01-28 HISTORY — DX: Vitamin D deficiency, unspecified: E55.9

## 2015-01-28 LAB — TB SKIN TEST
Induration: 0 mm
TB SKIN TEST: NEGATIVE

## 2015-01-28 NOTE — Progress Notes (Signed)
Quick Note:  Please call and tell her: Labs mostly ok I think that iron still low despite normal hemoglobin  vitamin D is low and you should supplement  1) Ferrous sulfate 325 mg daily 2) vit d 50K U weekly x 8 weeks # 8 no Refill 3) CBC, ferritin and vit D level in 3 months please   ______

## 2015-01-31 ENCOUNTER — Other Ambulatory Visit: Payer: Self-pay

## 2015-01-31 DIAGNOSIS — D509 Iron deficiency anemia, unspecified: Secondary | ICD-10-CM

## 2015-01-31 MED ORDER — ERGOCALCIFEROL 1.25 MG (50000 UT) PO CAPS: 50000.0000 [IU] | ORAL_CAPSULE | ORAL | Status: DC

## 2015-01-31 MED ORDER — IRON 325 (65 FE) MG PO TABS: 325.0000 mg | ORAL_TABLET | Freq: Every day | ORAL | Status: DC

## 2015-02-09 ENCOUNTER — Other Ambulatory Visit: Payer: Self-pay

## 2015-02-09 MED ORDER — ADALIMUMAB 40 MG/0.8ML ~~LOC~~ PSKT: 40.0000 mg | PREFILLED_SYRINGE | SUBCUTANEOUS | Status: DC

## 2015-02-10 ENCOUNTER — Telehealth: Payer: Self-pay | Admitting: Internal Medicine

## 2015-02-10 NOTE — Telephone Encounter (Signed)
Verified with the pharmacy that she should have the Humira Pens and not the syringes.  They will change to the pens

## 2015-02-20 ENCOUNTER — Encounter: Payer: Self-pay | Admitting: Internal Medicine

## 2015-06-28 IMAGING — US US OB COMP +14 WK
1 series · 12 of 28 positions shown · non-contrast
Comparison: none

[Series 1: us ob comp +14 wk · 91 acquisitions, 12 frames shown]
[im 4/91]
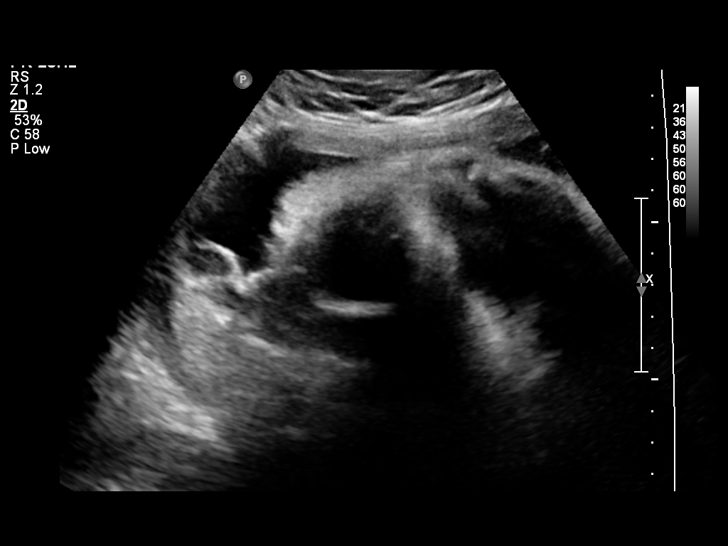
[im 11/91]
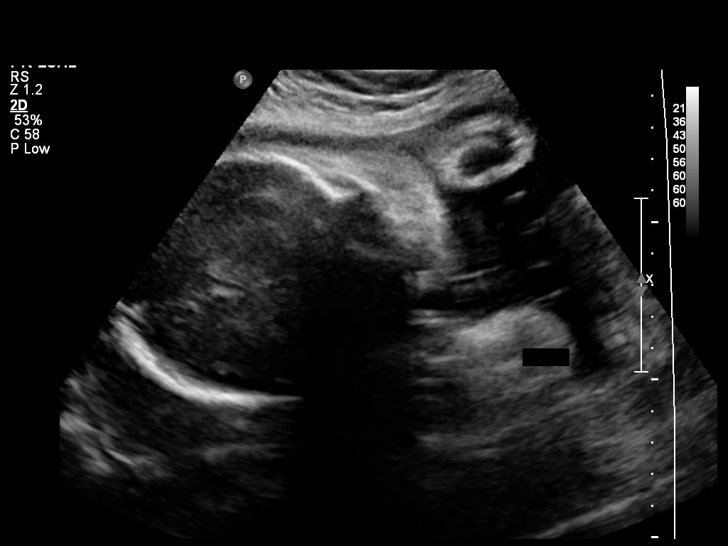
[im 17/91]
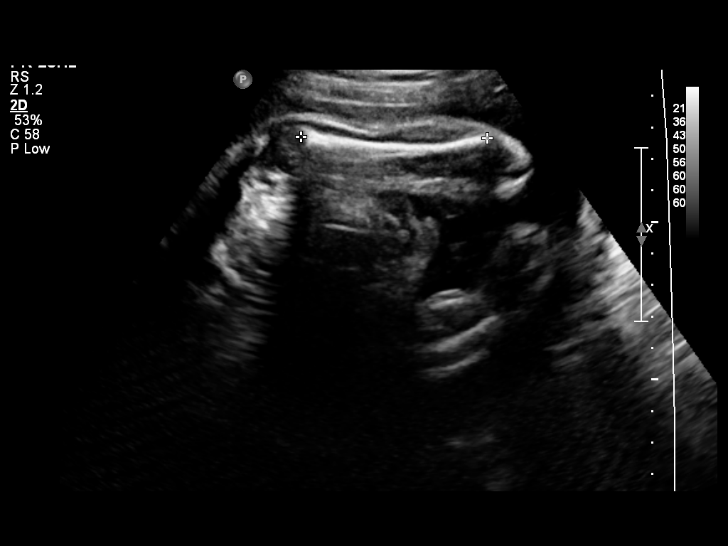
[im 27/91]
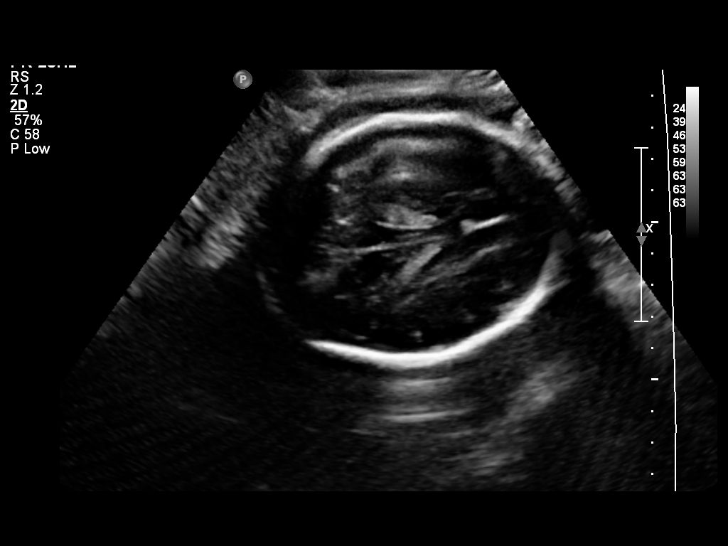
[im 34/91]
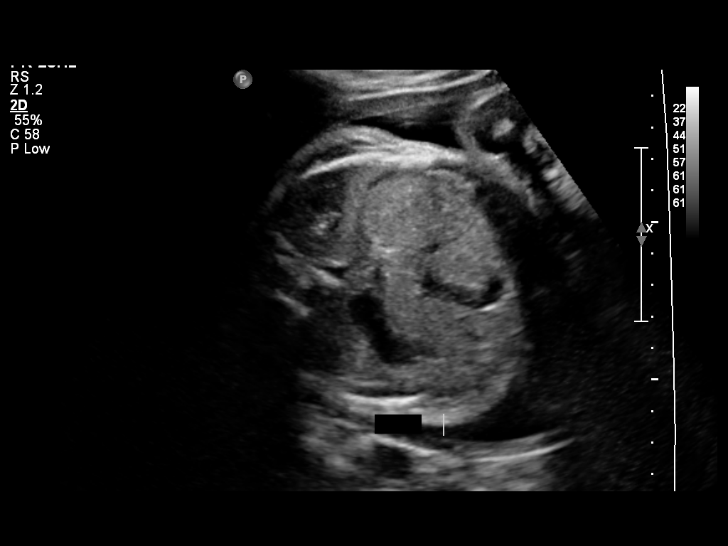
[im 41/91]
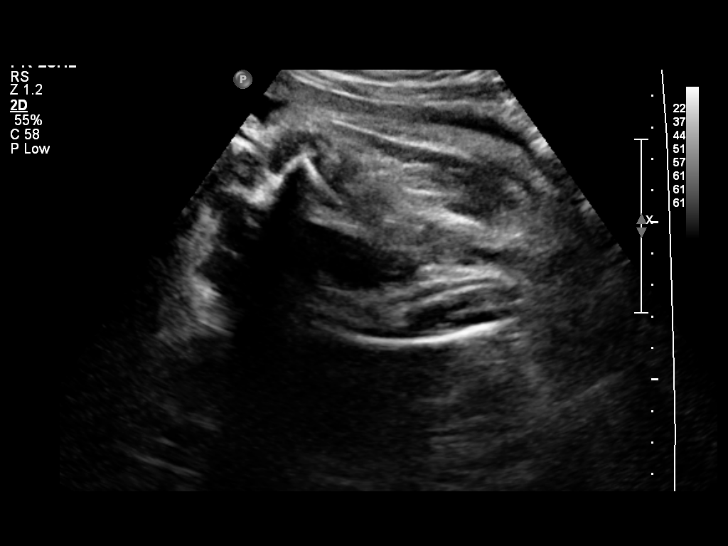
[im 51/91]
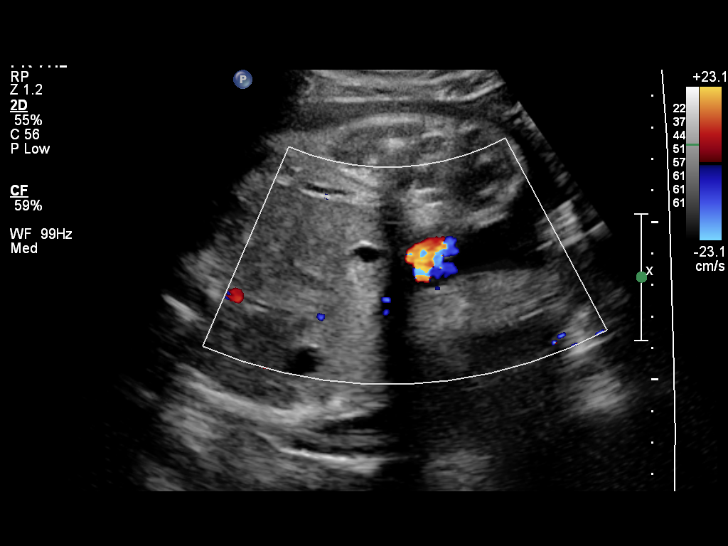
[im 57/91]
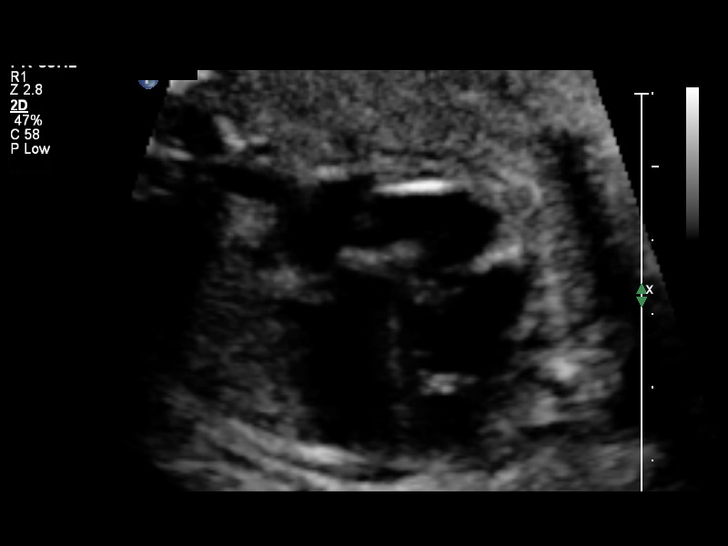
[im 64/91]
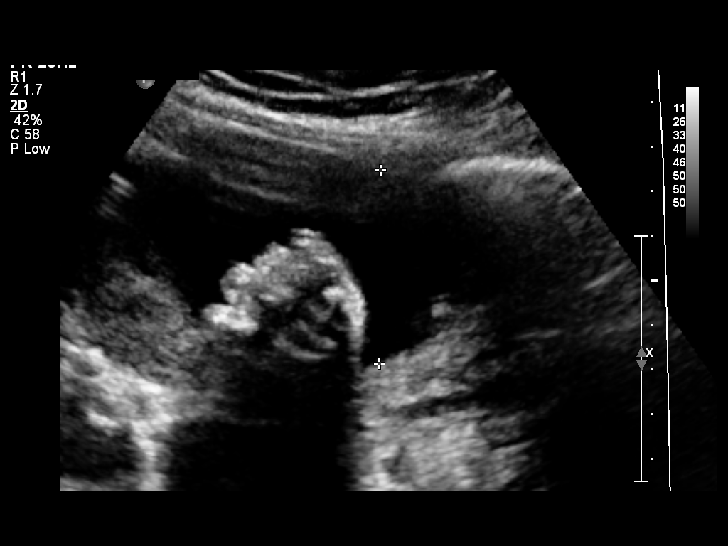
[im 74/91]
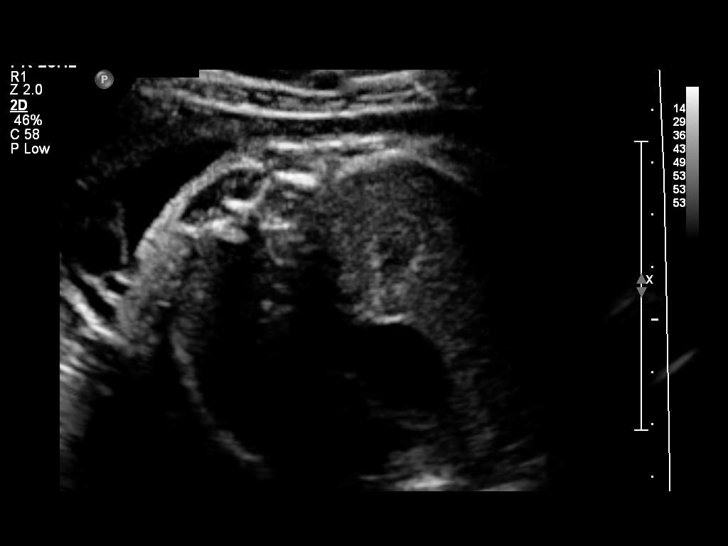
[im 81/91]
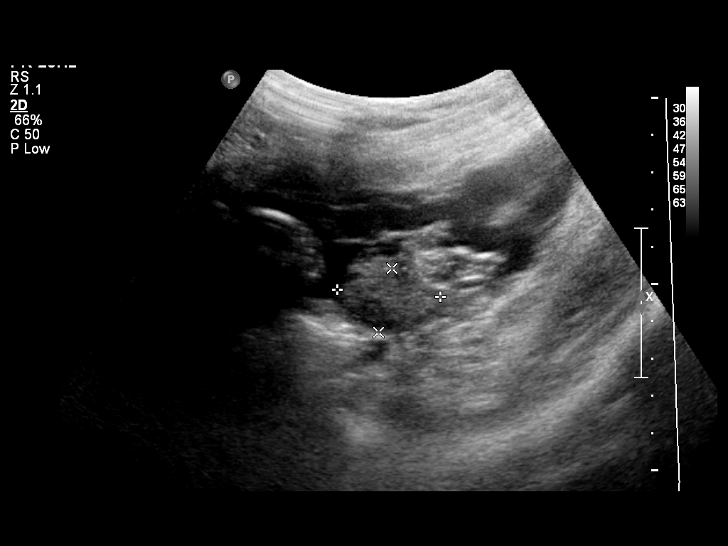
[im 87/91]
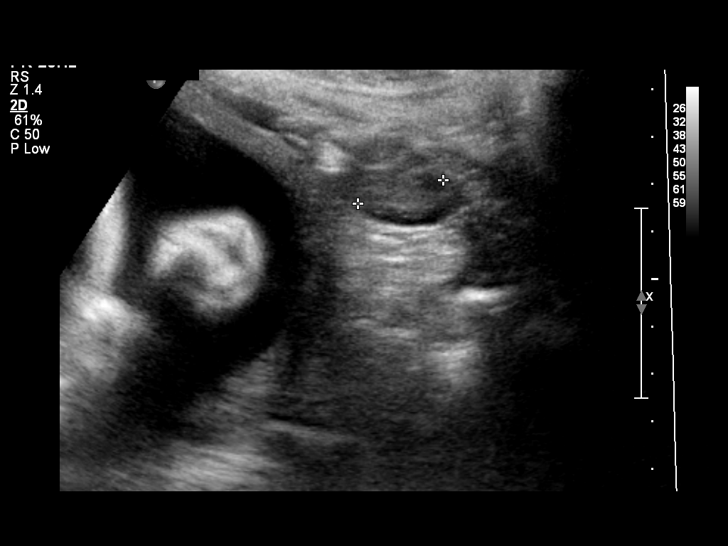

[12 of 28 positions shown; findings below may reference images not displayed]

OBSTETRICS REPORT
                      (Signed Final 04/08/2014 [DATE])

Service(s) Provided

 US OB COMP + 14 WK                                    76805.1
Indications

 Severe ulcerative collitis with 30 lb weight loss
 Abdominal pain - generalized
 Size less than dates (Small for gestational [AGE]
 FGR)
Fetal Evaluation

 Num Of Fetuses:    1
 Fetal Heart Rate:  144                          bpm
 Cardiac Activity:  Observed
 Presentation:      Frank breech
 Placenta:          Posterior Fundal, above
                    cervical os
 P. Cord            Not well visualized
 Insertion:

 Amniotic Fluid
 AFI FV:      Subjectively within normal limits
 AFI Sum:     12.64   cm       36  %Tile     Larg Pckt:    4.89  cm
 RUQ:   3.43    cm   LUQ:    4.32   cm    LLQ:   4.89    cm
Biometry

 BPD:     74.4  mm     G. Age:  29w 6d                CI:        66.79   70 - 86
                                                      FL/HC:      22.2   19.3 -

 HC:     291.7  mm     G. Age:  32w 1d       37  %    HC/AC:      1.03   0.96 -

 AC:     284.1  mm     G. Age:  32w 3d       79  %    FL/BPD:     87.0   71 - 87
 FL:      64.7  mm     G. Age:  33w 3d       87  %    FL/AC:      22.8   20 - 24
 HUM:     57.7  mm     G. Age:  33w 3d     > 95  %
 CER:     39.8  mm     G. Age:  33w 6d       87  %

 Est. FW:    8988  gm      4 lb 6 oz     76  %
Gestational Age

 Clinical EDD:  31w 2d                                        EDD:   06/08/14
 U/S Today:     32w 0d                                        EDD:   06/03/14
 Best:          31w 2d     Det. By:  Clinical EDD             EDD:   06/08/14
Anatomy

 Cranium:          Appears normal         Aortic Arch:      Not well visualized
 Fetal Cavum:      Appears normal         Ductal Arch:      Not well visualized
 Ventricles:       Appears normal         Diaphragm:        Appears normal
 Choroid Plexus:   Appears normal         Stomach:          Appears normal, left
                                                            sided
 Cerebellum:       Appears normal         Abdomen:          Appears normal
 Posterior Fossa:  Appears normal         Abdominal Wall:   Appears nml (cord
                                                            insert, abd wall)
 Nuchal Fold:      Not applicable (>20    Cord Vessels:     Appears normal (3
                   wks GA)                                  vessel cord)
 Face:             Appears normal         Kidneys:          Appear normal
                   (orbits and profile)
 Lips:             Appears normal         Bladder:          Appears normal
 Heart:            Appears normal         Spine:            Appears normal
                   (4CH, axis, and                          LTD LS views
                   situs)
 RVOT:             Appears normal         Lower             Appears normal
                                          Extremities:
 LVOT:             Appears normal         Upper             Appears normal
                                          Extremities:

 Other:  Nasal bone visualized. Male gender. Technically difficult due to
         advanced GA and fetal position.
Cervix Uterus Adnexa

 Cervix:       Not visualized (advanced GA >09wks)

 Left Ovary:    Within normal limits.
 Right Ovary:   Within normal limits.
Impression

 SIUP at 31+2 weeks
 Normal interval anatomy; anatomic survey complete except
 for arches; no images of great vessels crossing
 Normal amniotic fluid volume
 Appropriate interval growth with EFW at the 76th %tile
Recommendations

 Follow-up as clinically indicated

 questions or concerns.

## 2015-09-07 ENCOUNTER — Telehealth: Payer: Self-pay

## 2015-09-07 NOTE — Telephone Encounter (Signed)
Received a letter from patient's insurance that patient " may be taking their biologic medications differently than we expected based on our pharmacy claims report".  Patient reports that she is taking every 14 days.  She will be changing insurance from [employer] to an [employer] plan at the beginning of the year.  She will get me the new insurance information as soon as she has it so we can work on prior [place].

## 2015-10-12 ENCOUNTER — Telehealth: Payer: Self-pay | Admitting: Internal Medicine

## 2015-10-12 NOTE — Telephone Encounter (Signed)
I left the patient a message that I need a contact number, fax number, or a address for the pharmacy

## 2015-10-13 MED ORDER — ADALIMUMAB 40 MG/0.8ML ~~LOC~~ PSKT: 40.0000 mg | PREFILLED_SYRINGE | SUBCUTANEOUS | Status: DC

## 2015-10-13 NOTE — Telephone Encounter (Signed)
Script called into [facility].  Patient will back with new insurance cards/info

## 2015-10-17 ENCOUNTER — Telehealth: Payer: Self-pay | Admitting: Internal Medicine

## 2015-10-17 NOTE — Telephone Encounter (Signed)
Humira approved for 6 months with [employer].  Patient notified

## 2015-11-11 ENCOUNTER — Other Ambulatory Visit: Payer: Self-pay

## 2015-11-11 ENCOUNTER — Other Ambulatory Visit (HOSPITAL_COMMUNITY)
Admission: RE | Admit: 2015-11-11 | Discharge: 2015-11-11 | Disposition: A | Discharge status: Home / Self Care | Payer: Managed Care, Other (non HMO) | Source: Ambulatory Visit | Attending: Family | Admitting: Family

## 2015-11-11 DIAGNOSIS — Z01419 Encounter for gynecological examination (general) (routine) without abnormal findings: Secondary | ICD-10-CM | POA: Diagnosis not present

## 2015-11-11 DIAGNOSIS — Z113 Encounter for screening for infections with a predominantly sexual mode of transmission: Secondary | ICD-10-CM | POA: Insufficient documentation

## 2015-11-11 DIAGNOSIS — N760 Acute vaginitis: Secondary | ICD-10-CM | POA: Insufficient documentation

## 2015-11-15 LAB — CYTOLOGY - PAP

## 2015-11-29 ENCOUNTER — Encounter: Payer: Self-pay | Admitting: Internal Medicine

## 2015-12-06 ENCOUNTER — Encounter: Payer: Self-pay | Admitting: Internal Medicine

## 2015-12-06 ENCOUNTER — Other Ambulatory Visit (INDEPENDENT_AMBULATORY_CARE_PROVIDER_SITE_OTHER): Payer: Managed Care, Other (non HMO)

## 2015-12-06 ENCOUNTER — Ambulatory Visit (INDEPENDENT_AMBULATORY_CARE_PROVIDER_SITE_OTHER): Payer: Managed Care, Other (non HMO) | Admitting: Internal Medicine

## 2015-12-06 VITALS — BP 100/70 | HR 68 | Ht 73.0 in | Wt 188.6 lb

## 2015-12-06 DIAGNOSIS — Z79899 Other long term (current) drug therapy: Secondary | ICD-10-CM | POA: Diagnosis not present

## 2015-12-06 DIAGNOSIS — K5150 Left sided colitis without complications: Secondary | ICD-10-CM | POA: Diagnosis not present

## 2015-12-06 DIAGNOSIS — R197 Diarrhea, unspecified: Secondary | ICD-10-CM | POA: Diagnosis not present

## 2015-12-06 DIAGNOSIS — Z7960 Long term (current) use of unspecified immunomodulators and immunosuppressants: Secondary | ICD-10-CM

## 2015-12-06 LAB — COMPREHENSIVE METABOLIC PANEL
ALT: 13 U/L (ref 0–35)
AST: 14 U/L (ref 0–37)
Albumin: 4.1 g/dL (ref 3.5–5.2)
Alkaline Phosphatase: 46 U/L (ref 39–117)
BUN: 11 mg/dL (ref 6–23)
CO2: 26 meq/L (ref 19–32)
Calcium: 9.5 mg/dL (ref 8.4–10.5)
Chloride: 106 mEq/L (ref 96–112)
Creatinine, Ser: 0.83 mg/dL (ref 0.40–1.20)
GFR: 87.38 mL/min (ref >60.00)
GLUCOSE: 78 mg/dL (ref 70–99)
Potassium: 4.5 mEq/L (ref 3.5–5.1)
SODIUM: 138 meq/L (ref 135–145)
Total Bilirubin: 0.6 mg/dL (ref 0.2–1.2)
Total Protein: 7.1 g/dL (ref 6.0–8.3)

## 2015-12-06 LAB — CBC WITH DIFFERENTIAL/PLATELET
Basophils Absolute: 0 10*3/uL (ref 0.0–0.1)
Basophils Relative: 0.6 % (ref 0.0–3.0)
EOS PCT: 4.2 % (ref 0.0–5.0)
Eosinophils Absolute: 0.3 10*3/uL (ref 0.0–0.7)
HCT: 38.5 % (ref 36.0–46.0)
Hemoglobin: 12.8 g/dL (ref 12.0–15.0)
LYMPHS PCT: 34.3 % (ref 12.0–46.0)
Lymphs Abs: 2.5 10*3/uL (ref 0.7–4.0)
MCHC: 33.3 g/dL (ref 30.0–36.0)
MCV: 85.8 fl (ref 78.0–100.0)
Monocytes Absolute: 0.5 10*3/uL (ref 0.1–1.0)
Monocytes Relative: 6.3 % (ref 3.0–12.0)
NEUTROS ABS: 4 10*3/uL (ref 1.4–7.7)
Neutrophils Relative %: 54.6 % (ref 43.0–77.0)
PLATELETS: 268 10*3/uL (ref 150.0–400.0)
RBC: 4.49 Mil/uL (ref 3.87–5.11)
RDW: 14.1 % (ref 11.5–15.5)
WBC: 7.4 10*3/uL (ref 4.0–10.5)

## 2015-12-06 LAB — C-REACTIVE PROTEIN: CRP: 0.2 mg/dL — ABNORMAL LOW (ref 0.5–20.0)

## 2015-12-06 NOTE — Assessment & Plan Note (Signed)
May need levels

## 2015-12-06 NOTE — Progress Notes (Signed)
   Subjective:    Patient ID: Deanna Willis, female    DOB: 05/27/1986, 30 y.o.   MRN: [id number] Cc; f/u ulcerative colitis HPI  Young ww w/ left sided UC - had been doing well on Humira - in last month came off Merina and changed to Nuvaring due to left ovarian cyst, had short course Augmentin for sinus infection...has noticed mucoid dc/stools, loose and some blood mixed in.  No fever. No diet changes  Also stopped breast feeding  Medications, allergies, past medical history, past surgical history, family history and social history are reviewed and updated in the EMR.  Review of Systems As above    Objective:   Physical Exam @BP  100/70 mmHg  Pulse 68  Ht 6\' 1"  (1.854 m)  Wt 188 lb 9.6 oz (85.548 kg)  BMI 24.89 kg/m2  LMP 11/30/2015  Breastfeeding? 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 Flare  Seems likely but ? C diff Will test for that, check CBC, CRP, CMET - if this is unrevealing need to check adalimumab levels/Abs At some point recsope depending upon clinical course Does not tolerate mesalamine - next addl Tx options would be HC supps vs Uceris I think  Long-term use of immunosuppressant medication - Humira May need levels   Diarrhea   WU:[id number]:Deanna Manson PasseyBrown, NP

## 2015-12-06 NOTE — Assessment & Plan Note (Addendum)
?   Flare  Seems likely but ? C diff Will test for that, check CBC, CRP, CMET - if this is unrevealing need to check adalimumab levels/Abs At some point recsope depending upon clinical course Does not tolerate mesalamine - next addl Tx options would be HC supps vs Uceris I think

## 2015-12-06 NOTE — Patient Instructions (Signed)
   Your physician has requested that you go to the basement for lab work before leaving today.    I appreciate the opportunity to care for you. Carl Gessner, MD, FACG  

## 2015-12-07 ENCOUNTER — Other Ambulatory Visit: Payer: Managed Care, Other (non HMO)

## 2015-12-07 DIAGNOSIS — R197 Diarrhea, unspecified: Secondary | ICD-10-CM

## 2015-12-07 NOTE — Progress Notes (Signed)
Quick Note:  Labs ok My Chart note ______

## 2015-12-08 LAB — CLOSTRIDIUM DIFFICILE BY PCR: CDIFFPCR: NOT DETECTED

## 2015-12-08 NOTE — Progress Notes (Signed)
Quick Note:  C diff is negative sp please have her do [facility] Anser ADA  Fill in last date of Humira and dosing info etc  I had originally told her we need to do a trough level but not so   ______

## 2015-12-12 ENCOUNTER — Other Ambulatory Visit: Payer: Self-pay

## 2015-12-12 ENCOUNTER — Encounter: Payer: Self-pay | Admitting: Internal Medicine

## 2015-12-12 DIAGNOSIS — K5150 Left sided colitis without complications: Secondary | ICD-10-CM

## 2015-12-14 ENCOUNTER — Other Ambulatory Visit: Payer: Self-pay | Admitting: Internal Medicine

## 2015-12-15 LAB — PROMETHEUS-MAIL

## 2016-01-01 ENCOUNTER — Encounter: Payer: Self-pay | Admitting: Internal Medicine

## 2016-01-04 ENCOUNTER — Encounter: Payer: Self-pay | Admitting: Internal Medicine

## 2016-01-19 ENCOUNTER — Other Ambulatory Visit: Payer: Self-pay | Admitting: Internal Medicine

## 2016-01-19 ENCOUNTER — Encounter: Payer: Self-pay | Admitting: Internal Medicine

## 2016-01-19 DIAGNOSIS — K6289 Other specified diseases of anus and rectum: Secondary | ICD-10-CM

## 2016-01-19 DIAGNOSIS — K51511 Left sided colitis with rectal bleeding: Secondary | ICD-10-CM

## 2016-01-19 MED ORDER — VSL#3 PO PACK: 2.0000 | PACK | Freq: Two times a day (BID) | ORAL | Status: DC

## 2016-01-19 MED ORDER — HYDROCORTISONE ACETATE 25 MG RE SUPP: 25.0000 mg | Freq: Every day | RECTAL | Status: DC

## 2016-01-20 ENCOUNTER — Encounter: Payer: Self-pay | Admitting: Internal Medicine

## 2016-01-20 ENCOUNTER — Other Ambulatory Visit: Payer: Self-pay | Admitting: Internal Medicine

## 2016-01-20 DIAGNOSIS — E559 Vitamin D deficiency, unspecified: Secondary | ICD-10-CM

## 2016-01-20 MED ORDER — VITAMIN D (ERGOCALCIFEROL) 1.25 MG (50000 UNIT) PO CAPS
50000.0000 [IU] | ORAL_CAPSULE | ORAL | Status: DC
Start: 2016-01-20 — End: 2018-02-14

## 2016-02-08 ENCOUNTER — Encounter: Payer: Self-pay | Admitting: Internal Medicine

## 2016-02-14 ENCOUNTER — Telehealth: Payer: Self-pay

## 2016-02-14 MED ORDER — ONDANSETRON HCL 4 MG PO TABS: ORAL_TABLET | ORAL | Status: DC

## 2016-02-14 MED ORDER — DICYCLOMINE HCL 20 MG PO TABS: 20.0000 mg | ORAL_TABLET | Freq: Four times a day (QID) | ORAL | Status: DC

## 2016-02-14 NOTE — Telephone Encounter (Signed)
-----   Message from Iva Booparl E Gessner, MD sent at 02/14/2016  8:52 AM EDT ----- Regarding: needs Rxes and colonoscopy Please see My Chart message  She needs a colonoscopy this week or next preferably  Needs dicyclomine 20 mg tabs 1 q 6 prn cramps/diarrhea may take 1/2 tab # 90 no RF  Ondansetron 4 mg tabs 1-2 every 6 hrs prn nausea, diarrhea # 30 no RF

## 2016-02-14 NOTE — Telephone Encounter (Signed)
rx sent Left message for patient to call back  

## 2016-02-16 NOTE — Telephone Encounter (Signed)
Left message for patient to call back  

## 2016-02-16 NOTE — Telephone Encounter (Signed)
Patient will come for pre-visit tomorrow and colon on 02/21/16

## 2016-02-17 ENCOUNTER — Ambulatory Visit (AMBULATORY_SURGERY_CENTER): Payer: Self-pay | Admitting: *Deleted

## 2016-02-17 VITALS — Ht 73.0 in | Wt 181.2 lb

## 2016-02-17 DIAGNOSIS — K5150 Left sided colitis without complications: Secondary | ICD-10-CM

## 2016-02-17 NOTE — Progress Notes (Signed)
No allergies to  soy. No problems with anesthesia.  PT SAYS SHE IS NOT ALLERGIC TO EGGS BUT AVOIDS BECAUSE CAUSE GI UPSET  Pt given Emmi instructions for colonoscopy  No oxygen use  No diet drug use

## 2016-02-21 ENCOUNTER — Ambulatory Visit (AMBULATORY_SURGERY_CENTER): Payer: Managed Care, Other (non HMO) | Admitting: Internal Medicine

## 2016-02-21 ENCOUNTER — Encounter: Payer: Self-pay | Admitting: Internal Medicine

## 2016-02-21 VITALS — BP 106/74 | HR 78 | Temp 98.9°F | Resp 15 | Ht 73.0 in | Wt 181.0 lb

## 2016-02-21 DIAGNOSIS — K529 Noninfective gastroenteritis and colitis, unspecified: Secondary | ICD-10-CM | POA: Diagnosis not present

## 2016-02-21 DIAGNOSIS — K5150 Left sided colitis without complications: Secondary | ICD-10-CM | POA: Diagnosis not present

## 2016-02-21 MED ORDER — DIPHENOXYLATE-ATROPINE 2.5-0.025 MG PO TABS: 1.0000 | ORAL_TABLET | Freq: Four times a day (QID) | ORAL | Status: DC | PRN

## 2016-02-21 MED ORDER — PREDNISONE 10 MG PO TABS: 40.0000 mg | ORAL_TABLET | Freq: Every day | ORAL | Status: DC

## 2016-02-21 MED ORDER — SODIUM CHLORIDE 0.9 % IV SOLN: 500.0000 mL | INTRAVENOUS | Status: DC

## 2016-02-21 NOTE — Patient Instructions (Addendum)
The colitis is definitely active - mostly in the left colon. Will discuss treatment options today and when biopsies are back.  For now will start prednisone and Lomotil. I am thinking Thompson Grayerntyvio is next treatment for you.  Please take 1200 mg calcium (5 Tums a day is one option) along with vitamin D  I appreciate the opportunity to care for you. Iva Booparl E. Gessner, MD, FACG    YOU HAD AN ENDOSCOPIC PROCEDURE TODAY AT THE [facility]:   Refer to the procedure report that was given to you for any specific questions about what was found during the examination.  If the procedure report does not answer your questions, please call your gastroenterologist to clarify.  If you requested that your care partner not be given the details of your procedure findings, then the procedure report has been included in a sealed envelope for you to review at your convenience later.  YOU SHOULD EXPECT: Some feelings of bloating in the abdomen. Passage of more gas than usual.  Walking can help get rid of the air that was put into your GI tract during the procedure and reduce the bloating. If you had a lower endoscopy (such as a colonoscopy or flexible sigmoidoscopy) you may notice spotting of blood in your stool or on the toilet paper. If you underwent a bowel prep for your procedure, you may not have a normal bowel movement for a few days.  Please Note:  You might notice some irritation and congestion in your nose or some drainage.  This is from the oxygen used during your procedure.  There is no need for concern and it should clear up in a day or so.  SYMPTOMS TO REPORT IMMEDIATELY:   Following lower endoscopy (colonoscopy or flexible sigmoidoscopy):  Excessive amounts of blood in the stool  Significant tenderness or worsening of abdominal pains  Swelling of the abdomen that is new, acute  Fever of 100F or higher   For urgent or emergent issues, a gastroenterologist can be reached at any hour  by calling [phone].   DIET: Your first meal following the procedure should be a small meal and then it is ok to progress to your normal diet. Heavy or fried foods are harder to digest and may make you feel nauseous or bloated.  Likewise, meals heavy in dairy and vegetables can increase bloating.  Drink plenty of fluids but you should avoid alcoholic beverages for 24 hours.  ACTIVITY:  You should plan to take it easy for the rest of today and you should NOT DRIVE or use heavy machinery until tomorrow (because of the sedation medicines used during the test).    FOLLOW UP: Our staff will call the number listed on your records the next business day following your procedure to check on you and address any questions or concerns that you may have regarding the information given to you following your procedure. If we do not reach you, we will leave a message.  However, if you are feeling well and you are not experiencing any problems, there is no need to return our call.  We will assume that you have returned to your regular daily activities without incident.  If any biopsies were taken you will be contacted by phone or by letter within the next 1-3 weeks.  Please call us at [phone] if you have not heard about the biopsies in 3 weeks.    SIGNATURES/CONFIDENTIALITY: You and/or your care partner have signed paperwork  which will be entered into your electronic medical record.  These signatures attest to the fact that that the information above on your After Visit Summary has been reviewed and is understood.  Full responsibility of the confidentiality of this discharge information lies with you and/or your care-partner.    Handout was given to your care partner on ulcerative colitis. Prescription printed for Lomotil and given to pt's friend.  Also a prescription was sent to the pharmacy for prednisone. You may resume your other current medications today. Await biopsy results. Please call if  any questions or concerns.

## 2016-02-21 NOTE — Progress Notes (Signed)
No problems noted in the recovery room. maw 

## 2016-02-21 NOTE — Progress Notes (Signed)
Patient awakening,vss,report to rn 

## 2016-02-21 NOTE — Progress Notes (Signed)
Called to room to assist during endoscopic procedure.  Patient ID and intended procedure confirmed with present staff. Received instructions for my participation in the procedure from the performing physician.  

## 2016-02-21 NOTE — Op Note (Signed)
[facility] Patient Name: Deanna Willis Procedure Date: 02/21/2016 3:46 PM MRN: [id number] Endoscopist: Iva Boop , MD Age: 30 Referring MD:  Date of Birth: [birth year] Gender: Female Procedure:                Colonoscopy Indications:              Determine extent and severity of inflammatory bowel                            disease, Left-sided chronic ulcerative colitis,                            Follow-up of left-sided chronic ulcerative colitis,                            Disease activity assessment of left-sided chronic                            ulcerative colitis, Assess therapeutic response to                            therapy of left-sided chronic ulcerative colitis Medicines:                Propofol per Anesthesia, Monitored Anesthesia Care Procedure:                Pre-Anesthesia Assessment:                           - Prior to the procedure, a History and Physical                            was performed, and patient medications and                            allergies were reviewed. The patient's tolerance of                            previous anesthesia was also reviewed. The risks                            and benefits of the procedure and the sedation                            options and risks were discussed with the patient.                            All questions were answered, and informed consent                            was obtained. Prior Anticoagulants: The patient has                            taken no previous anticoagulant or antiplatelet  agents. ASA Grade Assessment: II - A patient with                            mild systemic disease. After reviewing the risks                            and benefits, the patient was deemed in                            satisfactory condition to undergo the procedure.                           After obtaining informed consent, the colonoscope                            was  passed under direct vision. Throughout the                            procedure, the patient's blood pressure, pulse, and                            oxygen saturations were monitored continuously. The                            Model CF-HQ190L [id number](SN#[id number]) scope was introduced                            through the anus and advanced to the the terminal                            ileum, with identification of the appendiceal                            orifice and IC valve. The colonoscopy was performed                            without difficulty. The patient tolerated the                            procedure well. The quality of the bowel                            preparation was adequate. The bowel preparation                            used was Miralax. The terminal ileum, ileocecal                            valve, appendiceal orifice, and rectum were                            photographed. Scope In: 3:59:06 PM Scope Out: 4:07:49 PM Total Procedure Duration: 0 hours 8 minutes 43 seconds  Findings:  The perianal and digital rectal examinations were                            normal.                           Inflammation was found in a continuous and                            circumferential pattern from the rectum to the                            descending colon (ulceration, edema, friable)and as                            patches surrounded by normal mucosa in the                            ascending colon and at the cecum (aphthae only).                            This was graded as Mayo Score 3 (severe, with                            spontaneous bleeding, ulcerations), and when                            compared to the previous examination, the findings                            have worsened. Biopsies were taken with a cold                            forceps for histology. Verification of patient                            identification for the specimen was  done. Estimated                            blood loss was minimal.                           The terminal ileum appeared normal. Complications:            No immediate complications. Estimated Blood Loss:     Estimated blood loss was minimal. Impression:               - Severe (Mayo Score 3) ulcerative colitis. The                            findings have worsened since the last examination.                            Biopsied.                           -  The examined portion of the ileum was normal. Recommendation:           - Patient has a contact number available for                            emergencies. The signs and symptoms of potential                            delayed complications were discussed with the                            patient. Return to normal activities tomorrow.                            Written discharge instructions were provided to the                            patient.                           - Resume previous diet.                           - Continue present medications.                           - Await pathology results.                           - Repeat colonoscopy is recommended. The                            colonoscopy date will be determined after pathology                            results from today's exam become available for                            review.                           - will need to change or add treatment or both Iva Boop, MD 02/21/2016 4:16:43 PM This report has been signed electronically.

## 2016-02-22 ENCOUNTER — Telehealth: Payer: Self-pay | Admitting: *Deleted

## 2016-02-22 NOTE — Telephone Encounter (Signed)
  Follow up Call-  Call back number 02/21/2016  Post procedure Call Back phone  # [phone]  Permission to leave phone message Yes     Patient questions:  Do you have a fever, pain , or abdominal swelling? No. Pain Score  0 *  Have you tolerated food without any problems? Yes.    Have you been able to return to your normal activities? Yes.    Do you have any questions about your discharge instructions: Diet   No. Medications  No. Follow up visit  No.  Do you have questions or concerns about your Care? No.  Actions: * If pain score is 4 or above: No action needed, pain <4.

## 2016-02-23 ENCOUNTER — Encounter: Payer: Managed Care, Other (non HMO) | Admitting: Internal Medicine

## 2016-02-23 ENCOUNTER — Encounter: Payer: Self-pay | Admitting: Internal Medicine

## 2016-02-24 ENCOUNTER — Telehealth: Payer: Self-pay

## 2016-02-24 ENCOUNTER — Telehealth: Payer: Self-pay | Admitting: Internal Medicine

## 2016-02-24 NOTE — Telephone Encounter (Signed)
-----   Message from Iva Booparl E Gessner, MD sent at 02/24/2016  9:23 AM EDT ----- Regarding: start entyvio She needs to start entyvio Humira not working (dc)  Let me know please

## 2016-02-24 NOTE — Telephone Encounter (Signed)
Left message for patient that we have her samples/coupon, they are in the fridge on side A.

## 2016-02-24 NOTE — Telephone Encounter (Signed)
Is this for the VSL #3 Sir?

## 2016-02-24 NOTE — Telephone Encounter (Signed)
Yes we can give her samples needs 2 reg or 1 DS a day

## 2016-02-24 NOTE — Telephone Encounter (Signed)
[facility]

## 2016-02-27 ENCOUNTER — Telehealth: Payer: Self-pay

## 2016-02-27 DIAGNOSIS — K5150 Left sided colitis without complications: Secondary | ICD-10-CM

## 2016-02-27 NOTE — Progress Notes (Signed)
Quick Note:  Biopsies show active colitis My Chart message - no letter and no recall ______

## 2016-02-27 NOTE — Telephone Encounter (Signed)
-----  Message from Darden Dates sent at 02/27/2016  2:48 PM EDT ----- Regarding: RE: start entyvio JUST LETTING YOU KNOW THAT PATIENT IS COVERED 100% RIGHT NOW BECAUSE SHE HAS MET HER DED AND OOP. IT WILL REQ PRECERT.  I AM WAITING FOR THEM TO FAX ME A FORM TO FILL OUT SO I CAN SEND BACK WITH CLINICALS.  TURN AROUND TIME COULD TAKE UP TO 15 DAYS WITH [employer].  (MOST OF THE TIME IT DOESN'T TAKE THAT LONG) JUST WANTED TO KEEP UP UPDATED ON THIS. THANKS AMY ----- Message -----    From: Marlon Pel, RN    Sent: 02/24/2016   2:15 PM      To: Amy L [place] Subject: RE: start entyvio                              K51.50.  Thanks.  Have a good weekend ----- Message -----    From: Darden Dates    Sent: 02/24/2016   2:02 PM      To: Marlon Pel, RN Subject: RE: start entyvio                              OH I NEED TO ASK YOU WHAT IS THE DX CODE YOU WILL BE USING? ILL GET BENEFITS AND IF PRECERT IS NEEDED ILL START IT.  MAY BE Monday BEFORE I CAN GET TO IT THANKS AMY ----- Message -----    From: Marlon Pel, RN    Sent: 02/24/2016   1:11 PM      To: Amy L [place] Subject: FW: start entyvio                              Will you please look into a pre-cert for Entyvio for this patient?  She has failed Humira.  Thank you ----- Message -----    From: Gatha Mayer, MD    Sent: 02/24/2016   9:23 AM      To: Marlon Pel, RN Subject: start entyvio                                  She needs to start entyvio Humira not working (dc)  Let me know please

## 2016-02-29 ENCOUNTER — Other Ambulatory Visit: Payer: Self-pay | Admitting: Internal Medicine

## 2016-03-06 NOTE — Telephone Encounter (Signed)
Amy just got word that Thompson Grayerntyvio was denied by her insurance company.  Do you want to try Remicade or alternate biologic?

## 2016-03-06 NOTE — Telephone Encounter (Signed)
NO MAAM.  I CALLED YESTERDAY AND THEY SAID IT WAS STILL IN CLINICAL REVIEW. SOMETIMES IT CAN A WEEK OR MORE WITH [employer].  IT WAS FAXED ON 6/6 IN THE MORNING.  HOPING TO GET WORD IN THE NEXT DAY OR SO.

## 2016-03-06 NOTE — Telephone Encounter (Signed)
Amy, any word on approval or denial?

## 2016-03-07 MED ORDER — DIPHENOXYLATE-ATROPINE 2.5-0.025 MG PO TABS: 1.0000 | ORAL_TABLET | Freq: Four times a day (QID) | ORAL | Status: DC | PRN

## 2016-03-07 MED ORDER — ONDANSETRON HCL 4 MG PO TABS
ORAL_TABLET | ORAL | Status: DC
Start: 2016-03-07 — End: 2018-02-14

## 2016-03-07 NOTE — Telephone Encounter (Signed)
LM FOR NURSE IN CLINICAL REVIEW TO CALL ME BACK.  NEED TO SEE IF WE CAN GET REMICADE APPROVED WITH INFORMATION THAT WAS SENT FOR ENTYVIO PRECERT.

## 2016-03-07 NOTE — Telephone Encounter (Signed)
Spoke toher  1) DC Humira 2) Start Remicade 10 mg/kg initiation 0,2 and 6 weeks then every 8 weeks 3) She probably has hand foot and mouth virus starting so we need to be sure she is done that before starting Remicade 4) Refill ondansetron and Lomotil please 5) Schedule her to see me in mid July please

## 2016-03-07 NOTE — Telephone Encounter (Signed)
rx faxed for lomotil and electronically sent for ondansetron

## 2016-03-07 NOTE — Telephone Encounter (Signed)
Deanna Willis, can we please initiate prior auth for Remicade please?

## 2016-03-09 ENCOUNTER — Encounter: Payer: Self-pay | Admitting: Internal Medicine

## 2016-03-09 NOTE — Telephone Encounter (Signed)
FAXED PRECERT FOR REMICADE TO [employer]

## 2016-03-09 NOTE — Telephone Encounter (Signed)
Dr Gessner please advise? 

## 2016-03-11 ENCOUNTER — Encounter: Payer: Self-pay | Admitting: Gastroenterology

## 2016-03-12 ENCOUNTER — Telehealth: Payer: Self-pay | Admitting: Internal Medicine

## 2016-03-12 NOTE — Telephone Encounter (Signed)
Pt called this morning and stated that [facility] has been [facility] due to a flare.

## 2016-03-12 NOTE — Telephone Encounter (Signed)
Patient is calling back. She is now wanting to talk to a nurse. She states that she also has a kidney infection. Best # [phone]

## 2016-03-12 NOTE — Telephone Encounter (Signed)
Noted and note sent to Dr. Leone PayorGessner as an Lorain ChildesFYI

## 2016-03-13 NOTE — Telephone Encounter (Signed)
Called pt at number below and left a message to call back.

## 2016-03-14 NOTE — Telephone Encounter (Signed)
PER SHELLY @ [employer], CASE IS STILL IN REVIEW BUT DECISION WILL BE MADE BY 4PM TODAY.

## 2016-03-15 NOTE — Telephone Encounter (Signed)
CLOSED ENCOUNTER ON ACCIDENT.  CHECKED WITH GABRIEL N @ [employer] ON STATUS OF THE CASE.  SAID CHANELLE NURSE STILL REVIEWING.  SAID WE WOULD HAVE A CASE DETERMINATION TODAY.

## 2016-03-19 NOTE — Telephone Encounter (Signed)
[employer] DENIED REMICADE.  DR. Leone PayorGESSNER WILL HAVE OPPORTUNITY FOR PEER TO PEER REVIEW. PT WILL BE CONTACTED WITH STATUS

## 2016-05-29 ENCOUNTER — Ambulatory Visit: Payer: Managed Care, Other (non HMO) | Admitting: Internal Medicine

## 2017-08-12 LAB — OB RESULTS CONSOLE GC/CHLAMYDIA
CHLAMYDIA, DNA PROBE: NEGATIVE
GC PROBE AMP, GENITAL: NEGATIVE

## 2017-08-12 LAB — OB RESULTS CONSOLE HIV ANTIBODY (ROUTINE TESTING): HIV: NONREACTIVE

## 2017-08-12 LAB — OB RESULTS CONSOLE ABO/RH: RH Type: POSITIVE

## 2017-08-12 LAB — OB RESULTS CONSOLE RUBELLA ANTIBODY, IGM: Rubella: IMMUNE

## 2017-08-12 LAB — OB RESULTS CONSOLE ANTIBODY SCREEN: Antibody Screen: NEGATIVE

## 2017-08-12 LAB — OB RESULTS CONSOLE HEPATITIS B SURFACE ANTIGEN: Hepatitis B Surface Ag: NEGATIVE

## 2017-08-12 LAB — OB RESULTS CONSOLE RPR: RPR: NONREACTIVE

## 2017-09-24 NOTE — L&D Delivery Note (Signed)
Patient was C/C/0 and pushed for 1hr 30 minutes with epidural.    NSVD female infant, Apgars 9/9 , weight pending.   The patient had a first degree laceration repaired with 3-0 vicryl. Fundus was firm. EBL was expected amount. Placenta was delivered intact. Vagina was clear.  Delayed cord clamping done for 30-60 seconds while warming baby. Baby was vigorous and doing skin to skin with mother.  Philip Aspen

## 2018-02-07 LAB — OB RESULTS CONSOLE GBS: STREP GROUP B AG: NEGATIVE

## 2018-02-10 ENCOUNTER — Telehealth (HOSPITAL_COMMUNITY): Payer: Self-pay | Admitting: *Deleted

## 2018-02-10 ENCOUNTER — Encounter (HOSPITAL_COMMUNITY): Payer: Self-pay | Admitting: *Deleted

## 2018-02-10 NOTE — Telephone Encounter (Signed)
Preadmission screen  

## 2018-02-13 ENCOUNTER — Other Ambulatory Visit: Payer: Self-pay | Admitting: Obstetrics and Gynecology

## 2018-02-14 ENCOUNTER — Inpatient Hospital Stay (HOSPITAL_COMMUNITY)
Admission: RE | Admit: 2018-02-14 | Discharge: 2018-02-17 | DRG: 805 | Disposition: A | Discharge status: Home / Self Care | Payer: BLUE CROSS/BLUE SHIELD | Source: Ambulatory Visit | Attending: Obstetrics and Gynecology | Admitting: Obstetrics and Gynecology

## 2018-02-14 VITALS — BP 96/61 | HR 80 | Temp 98.1°F | Resp 18 | Ht 73.0 in | Wt 237.1 lb

## 2018-02-14 DIAGNOSIS — K831 Obstruction of bile duct: Secondary | ICD-10-CM | POA: Diagnosis present

## 2018-02-14 DIAGNOSIS — Z3A37 37 weeks gestation of pregnancy: Secondary | ICD-10-CM | POA: Diagnosis not present

## 2018-02-14 DIAGNOSIS — O2662 Liver and biliary tract disorders in childbirth: Principal | ICD-10-CM | POA: Diagnosis present

## 2018-02-14 LAB — ABO/RH: ABO/RH(D): A POS

## 2018-02-14 LAB — RPR: RPR: NONREACTIVE

## 2018-02-14 LAB — CBC
HCT: 35 % — ABNORMAL LOW (ref 36.0–46.0)
HEMOGLOBIN: 11.6 g/dL — AB (ref 12.0–15.0)
MCH: 29.4 pg (ref 26.0–34.0)
MCHC: 33.1 g/dL (ref 30.0–36.0)
MCV: 88.8 fL (ref 78.0–100.0)
PLATELETS: 204 10*3/uL (ref 150–400)
RBC: 3.94 MIL/uL (ref 3.87–5.11)
RDW: 13.7 % (ref 11.5–15.5)
WBC: 11 10*3/uL — ABNORMAL HIGH (ref 4.0–10.5)

## 2018-02-14 MED ORDER — TERBUTALINE SULFATE 1 MG/ML IJ SOLN: 0.2500 mg | Freq: Once | INTRAMUSCULAR | Status: DC | PRN

## 2018-02-14 MED ORDER — OXYTOCIN BOLUS FROM INFUSION
500.0000 mL | Freq: Once | INTRAVENOUS | Status: AC
  Administered 2018-02-15: 500 mL via INTRAVENOUS

## 2018-02-14 MED ORDER — OXYTOCIN 40 UNITS IN LACTATED RINGERS INFUSION - SIMPLE MED
2.5000 [IU]/h | INTRAVENOUS | Status: DC
  Administered 2018-02-15: 2.5 [IU]/h via INTRAVENOUS
  Filled 2018-02-14: qty 1000

## 2018-02-14 MED ORDER — ACETAMINOPHEN 325 MG PO TABS
650.0000 mg | ORAL_TABLET | ORAL | Status: DC | PRN
  Administered 2018-02-14: 650 mg via ORAL
  Filled 2018-02-14: qty 2

## 2018-02-14 MED ORDER — SOD CITRATE-CITRIC ACID 500-334 MG/5ML PO SOLN
30.0000 mL | ORAL | Status: DC | PRN
Start: 2018-02-14 — End: 2018-02-15
  Administered 2018-02-15 (×2): 30 mL via ORAL
  Filled 2018-02-14 (×2): qty 15

## 2018-02-14 MED ORDER — LIDOCAINE HCL (PF) 1 % IJ SOLN
30.0000 mL | INTRAMUSCULAR | Status: DC | PRN
  Administered 2018-02-15: 30 mL via SUBCUTANEOUS
  Filled 2018-02-14: qty 30

## 2018-02-14 MED ORDER — OXYTOCIN 40 UNITS IN LACTATED RINGERS INFUSION - SIMPLE MED
1.0000 m[IU]/min | INTRAVENOUS | Status: DC
  Administered 2018-02-14: 2 m[IU]/min via INTRAVENOUS
  Filled 2018-02-14: qty 1000

## 2018-02-14 MED ORDER — ONDANSETRON HCL 4 MG/2ML IJ SOLN
4.0000 mg | Freq: Four times a day (QID) | INTRAMUSCULAR | Status: DC | PRN
  Administered 2018-02-15: 4 mg via INTRAVENOUS
  Filled 2018-02-14: qty 2

## 2018-02-14 MED ORDER — LACTATED RINGERS IV SOLN
INTRAVENOUS | Status: DC
  Administered 2018-02-14 – 2018-02-15 (×4): via INTRAVENOUS

## 2018-02-14 MED ORDER — LACTATED RINGERS IV SOLN
500.0000 mL | INTRAVENOUS | Status: DC | PRN
  Administered 2018-02-15: 1000 mL via INTRAVENOUS
  Administered 2018-02-15: 500 mL via INTRAVENOUS
  Administered 2018-02-15: 1000 mL via INTRAVENOUS
  Administered 2018-02-15: 500 mL via INTRAVENOUS

## 2018-02-14 NOTE — Progress Notes (Signed)
Pt here for induction for cholestasis at 37 1/7 weeks.  Has a birth plan she wishes [place] to follow.  Does not want pitocin.  Dr Claiborne Billings updated on pt desires.  Will come to discuss alternate POC after OR case.

## 2018-02-14 NOTE — H&P (Signed)
32 y.o. [redacted]w[redacted]d  G2P1001 comes in for schedule IOL for cholestasis of pregnancy.  Otherwise has good fetal movement and no bleeding.  Past Medical History:  Diagnosis Date  . Anal fissure 06/02/2013  . Iron deficiency anemia due to chronic blood loss 10/01/2013   Microcytic anemia in the setting of ulcerative colitis. Start iron supplements.   . ITP (idiopathic thrombocytopenic purpura)    last flare-up 2008; Question of associated tendency towards von Willebrand  . Left sided ulcerative (chronic) colitis w/ appendiceal inflammaition 03/11/2013  . Menorrhagia    Associated with thrombocytopenia, 2 episodes in teenage years responded the platelet infusion  . Status post vacuum-assisted vaginal delivery 05/26/2014  . Vitamin D deficiency 01/28/2015    Past Surgical History:  Procedure Laterality Date  . COLONOSCOPY W/ BIOPSIES  03/24/2013  . FLEXIBLE SIGMOIDOSCOPY N/A 04/06/2014   Procedure: FLEXIBLE SIGMOIDOSCOPY;  Surgeon: Louis Meckel, MD;  Location: [facility] END[facility]COPY;  Service: Endoscopy;  Laterality: N/A;  patient pregnant [redacted] weeks.   . TONSILLECTOMY  1997  . TONSILLECTOMY      OB History  Gravida Para Term Preterm AB Living  SAB TAB Ectopic Multiple Live Births          1    # Outcome Date GA Lbr Len/2nd Weight Sex Delivery Anes PTL Lv  2 Current           1 Term 05/26/14 [redacted]w[redacted]d -11:52 / 00:20 6 lb 15.5 oz (3.16 kg) M Vag-Vacuum EPI  LIV    Social History   Socioeconomic History  . Marital status: Married    Spouse name: Not on file  . Number of children: Not on file  . Years of education: Not on file  . Highest education level: Not on file  Occupational History  . Occupation: Hairstylist  Social Needs  . Financial resource strain: Not on file  . Food insecurity:    Worry: Not on file    Inability: Not on file  . Transportation needs:    Medical: Not on file    Non-medical: Not on file  Tobacco Use  . Smoking status: Never Smoker  . Smokeless tobacco: Never Used   Substance and Sexual Activity  . Alcohol use: Yes    Alcohol/week: 0.0 oz    Comment: OCC.; none while pregnant  . Drug use: No  . Sexual activity: Yes  Lifestyle  . Physical activity:    Days per week: Not on file    Minutes per session: Not on file  . Stress: Not on file  Relationships  . Social connections:    Talks on phone: Not on file    Gets together: Not on file    Attends religious service: Not on file    Active member of club or organization: Not on file    Attends meetings of clubs or organizations: Not on file    Relationship status: Not on file  . Intimate partner violence:    Fear of current or ex partner: Not on file    Emotionally abused: Not on file    Physically abused: Not on file    Forced sexual activity: Not on file  Other Topics Concern  . Not on file  Social History Narrative   Married, 1 son born September 2015   Hairstylist   01/26/2015 last update   Mesalamine er and Phenergan [promethazine hcl]    Prenatal Transfer Tool  Maternal Diabetes: No Genetic  Screening: Declined Maternal Ultrasounds/Referrals: Normal Fetal Ultrasounds or other Referrals:  None Maternal Substance Abuse:  No Significant Maternal Medications:  None Significant Maternal Lab Results: Lab values include: Group B Strep negative  Other PNC: uncomplicated.    Vitals:   02/14/18 0747 02/14/18 0808  BP: 137/80 111/67  Pulse: 96 96  Resp: 16 16  Temp: 98.7 F (37.1 C)   TempSrc: Oral   Weight: 237 lb 1.6 oz (107.5 kg)   Height:  (1.854 m)     Lungs/Cor:  NAD Abdomen:  soft, gravid Ex:  no cords, erythema SVE: awaiting FHTs:  135, good STV, NST R Toco:  awaiting  A/P   Admit for IOL for cholestasis of pregnancy  GBS Neg  Pitocin 2x2 and AROM when possible  Deanna Willis

## 2018-02-15 ENCOUNTER — Encounter (HOSPITAL_COMMUNITY): Payer: Self-pay

## 2018-02-15 LAB — CBC
HCT: 29 % — ABNORMAL LOW (ref 36.0–46.0)
HCT: 30 % — ABNORMAL LOW (ref 36.0–46.0)
HEMATOCRIT: 30.1 % — AB (ref 36.0–46.0)
Hemoglobin: 9.7 g/dL — ABNORMAL LOW (ref 12.0–15.0)
Hemoglobin: 10.1 g/dL — ABNORMAL LOW (ref 12.0–15.0)
Hemoglobin: 10.2 g/dL — ABNORMAL LOW (ref 12.0–15.0)
MCH: 29.5 pg (ref 26.0–34.0)
MCH: 29 pg (ref 26.0–34.0)
MCH: 29.1 pg (ref 26.0–34.0)
MCHC: 33.4 g/dL (ref 30.0–36.0)
MCHC: 33.7 g/dL (ref 30.0–36.0)
MCHC: 33.9 g/dL (ref 30.0–36.0)
MCV: 88.1 fL (ref 78.0–100.0)
MCV: 86.2 fL (ref 78.0–100.0)
MCV: 86 fL (ref 78.0–100.0)
PLATELETS: 216 10*3/uL (ref 150–400)
PLATELETS: 198 10*3/uL (ref 150–400)
Platelets: 213 10*3/uL (ref 150–400)
RBC: 3.29 MIL/uL — ABNORMAL LOW (ref 3.87–5.11)
RBC: 3.48 MIL/uL — AB (ref 3.87–5.11)
RBC: 3.5 MIL/uL — ABNORMAL LOW (ref 3.87–5.11)
RDW: 13.4 % (ref 11.5–15.5)
RDW: 14.2 % (ref 11.5–15.5)
RDW: 14.6 % (ref 11.5–15.5)
WBC: 21.7 10*3/uL — ABNORMAL HIGH (ref 4.0–10.5)
WBC: 19.4 10*3/uL — ABNORMAL HIGH (ref 4.0–10.5)
WBC: 19.8 10*3/uL — ABNORMAL HIGH (ref 4.0–10.5)

## 2018-02-15 LAB — PREPARE RBC (CROSSMATCH)

## 2018-02-15 LAB — DIC (DISSEMINATED INTRAVASCULAR COAGULATION)PANEL
D-Dimer, Quant: 0.81 ug{FEU}/mL — ABNORMAL HIGH (ref 0.00–0.50)
Fibrinogen: 361 mg/dL (ref 210–475)
INR: 1.08
Platelets: 221 K/uL (ref 150–400)
Prothrombin Time: 13.9 s (ref 11.4–15.2)
Smear Review: NONE SEEN
aPTT: 27 s (ref 24–36)

## 2018-02-15 LAB — POSTPARTUM HEMORRHAGE PROTOCOL (BB NOTIFICATION)

## 2018-02-15 MED ORDER — METHYLERGONOVINE MALEATE 0.2 MG/ML IJ SOLN: 0.2000 mg | INTRAMUSCULAR | Status: DC | PRN

## 2018-02-15 MED ORDER — TRANEXAMIC ACID 1000 MG/10ML IV SOLN
1000.0000 mg | Freq: Once | INTRAVENOUS | Status: AC
  Administered 2018-02-15: 1000 mg via INTRAVENOUS
  Filled 2018-02-15: qty 10

## 2018-02-15 MED ORDER — IBUPROFEN 600 MG PO TABS
600.0000 mg | ORAL_TABLET | Freq: Four times a day (QID) | ORAL | Status: DC
  Administered 2018-02-15 – 2018-02-17 (×7): 600 mg via ORAL
  Filled 2018-02-15 (×9): qty 1

## 2018-02-15 MED ORDER — ACETAMINOPHEN 325 MG PO TABS: 650.0000 mg | ORAL_TABLET | ORAL | Status: DC | PRN

## 2018-02-15 MED ORDER — MISOPROSTOL 200 MCG PO TABS
ORAL_TABLET | ORAL | Status: AC
  Administered 2018-02-15: 800 ug via RECTAL
  Filled 2018-02-15: qty 4

## 2018-02-15 MED ORDER — MISOPROSTOL 200 MCG PO TABS
ORAL_TABLET | ORAL | Status: AC
  Filled 2018-02-15: qty 4

## 2018-02-15 MED ORDER — METHYLERGONOVINE MALEATE 0.2 MG/ML IJ SOLN
0.2000 mg | Freq: Once | INTRAMUSCULAR | Status: AC
  Administered 2018-02-15: 0.2 mg via INTRAMUSCULAR

## 2018-02-15 MED ORDER — PRENATAL MULTIVITAMIN CH
1.0000 | ORAL_TABLET | Freq: Every day | ORAL | Status: DC
  Administered 2018-02-16: 1 via ORAL
  Filled 2018-02-15: qty 1

## 2018-02-15 MED ORDER — TETANUS-DIPHTH-ACELL PERTUSSIS 5-2.5-18.5 LF-MCG/0.5 IM SUSP
0.5000 mL | Freq: Once | INTRAMUSCULAR | Status: DC
  Filled 2018-02-15: qty 0.5

## 2018-02-15 MED ORDER — SENNOSIDES-DOCUSATE SODIUM 8.6-50 MG PO TABS
2.0000 | ORAL_TABLET | ORAL | Status: DC
  Administered 2018-02-15 – 2018-02-16 (×2): 2 via ORAL
  Filled 2018-02-15 (×2): qty 2

## 2018-02-15 MED ORDER — DIPHENHYDRAMINE HCL 25 MG PO CAPS: 25.0000 mg | ORAL_CAPSULE | Freq: Four times a day (QID) | ORAL | Status: DC | PRN

## 2018-02-15 MED ORDER — ONDANSETRON HCL 4 MG PO TABS: 4.0000 mg | ORAL_TABLET | ORAL | Status: DC | PRN

## 2018-02-15 MED ORDER — MENTHOL 3 MG MT LOZG
1.0000 | LOZENGE | OROMUCOSAL | Status: DC | PRN
  Filled 2018-02-15: qty 9

## 2018-02-15 MED ORDER — BENZOCAINE-MENTHOL 20-0.5 % EX AERO
1.0000 "application " | INHALATION_SPRAY | CUTANEOUS | Status: DC | PRN
  Administered 2018-02-16: 1 via TOPICAL
  Filled 2018-02-15: qty 56

## 2018-02-15 MED ORDER — ONDANSETRON HCL 4 MG/2ML IJ SOLN: 4.0000 mg | INTRAMUSCULAR | Status: DC | PRN

## 2018-02-15 MED ORDER — SODIUM CHLORIDE 0.9 % IV SOLN: Freq: Once | INTRAVENOUS | Status: DC | PRN

## 2018-02-15 MED ORDER — OXYCODONE-ACETAMINOPHEN 5-325 MG PO TABS
1.0000 | ORAL_TABLET | ORAL | Status: DC | PRN
  Administered 2018-02-15: 1 via ORAL
  Filled 2018-02-15: qty 1

## 2018-02-15 MED ORDER — ZOLPIDEM TARTRATE 5 MG PO TABS: 5.0000 mg | ORAL_TABLET | Freq: Every evening | ORAL | Status: DC | PRN

## 2018-02-15 MED ORDER — OXYCODONE-ACETAMINOPHEN 5-325 MG PO TABS: 2.0000 | ORAL_TABLET | ORAL | Status: DC | PRN

## 2018-02-15 MED ORDER — FENTANYL CITRATE (PF) 100 MCG/2ML IJ SOLN
100.0000 ug | Freq: Once | INTRAMUSCULAR | Status: AC
  Administered 2018-02-15: 100 ug via INTRAVENOUS

## 2018-02-15 MED ORDER — METHYLERGONOVINE MALEATE 0.2 MG PO TABS
0.2000 mg | ORAL_TABLET | ORAL | Status: DC | PRN
  Administered 2018-02-15: 0.2 mg via ORAL
  Filled 2018-02-15: qty 1

## 2018-02-15 MED ORDER — TRANEXAMIC ACID 1000 MG/10ML IV SOLN: 1.5000 mg/kg/h | INTRAVENOUS | Status: DC

## 2018-02-15 MED ORDER — MISOPROSTOL 200 MCG PO TABS
800.0000 ug | ORAL_TABLET | Freq: Once | ORAL | Status: AC
  Administered 2018-02-15: 800 ug via RECTAL

## 2018-02-15 MED ORDER — SODIUM CHLORIDE 0.9 % IV SOLN: Freq: Once | INTRAVENOUS | Status: DC

## 2018-02-15 MED ORDER — FENTANYL CITRATE (PF) 100 MCG/2ML IJ SOLN
INTRAMUSCULAR | Status: AC
  Administered 2018-02-15: 100 ug via INTRAVENOUS
  Filled 2018-02-15: qty 2

## 2018-02-15 MED ORDER — SIMETHICONE 80 MG PO CHEW: 80.0000 mg | CHEWABLE_TABLET | ORAL | Status: DC | PRN

## 2018-02-15 MED ORDER — DIBUCAINE 1 % RE OINT
1.0000 "application " | TOPICAL_OINTMENT | RECTAL | Status: DC | PRN
  Filled 2018-02-15: qty 28

## 2018-02-15 MED ORDER — WITCH HAZEL-GLYCERIN EX PADS: 1.0000 "application " | MEDICATED_PAD | CUTANEOUS | Status: DC | PRN

## 2018-02-15 MED ORDER — COCONUT OIL OIL
1.0000 "application " | TOPICAL_OIL | Status: DC | PRN
  Filled 2018-02-15: qty 120

## 2018-02-15 MED ORDER — FENTANYL CITRATE (PF) 100 MCG/2ML IJ SOLN: 100.0000 ug | Freq: Once | INTRAMUSCULAR | Status: DC

## 2018-02-15 NOTE — Progress Notes (Signed)
Called by RN to alert me that since delivery an additional 400cc of clot was evacuated totalling around 700cc currently no heavy bleeding.  Advised RN to administer cytotec rectally and 0.2mg  methergine and call if bleeding became heavy.  RN later returned call that patient was noted to have additional bleeding and with fundal massage additional clot was removed bringing EBL total to 1600cc.  She stated that pt was asymtomatic, HR in 90s, which it had been and BP low to 82/52 and returned to 99/59.  She stated since that evacuation of clot bleeding had subsided.  Shortly thereafter RN alerted me that pt became nauseous and lightheaded and faculty was called to evaluate while I was on my way to hospital.  See Dr. Waynard Edwards note.  I arrived after TXA started and Fentanyl given.  Pt stated she felt better and was pale in appearance.  I performed a speculum exam and noted abundant clot, fundal massage forced additional 400cc blood and 500cc clot from vagina.  Cervix was able to be visualized and bleeding lessened.  Foley catheter was placed.  No cervical laceration noted, no vaginal laceration noted.  Fundus then felt firm.  Gentle currettage of all four quadrants performed and minimal tissue and clot removed.  Attempt was made to place Bakri ballon but it extruding during filling process and it was felt that the uterus was remaining firm so that idea abandoned. I observed visually for 30 more minutes and reexamined uterine fundus several times.  It remained firm and bleeding scant.  With EBL approx 3000cc I consented pt for transfusion and 1U PRBCs started.  CBC drawn in the midst of bleeding revealed Hb of 9.7.  Anticipate that 4 units will be administered.  Repeat CBC scheduled for 3pm.

## 2018-02-15 NOTE — Progress Notes (Signed)
Doctor came in the  room to see patient. Doctor stated foley can be removed. CBC will be drawn at 22:00.

## 2018-02-15 NOTE — Lactation Note (Signed)
This note was copied from a baby's chart. Lactation Consultation Note  Patient Name: Girl Sina Sumpter [id number] Date: 02/15/2018 Reason for consult: Initial assessment;Early term 56-38.6wks  14 hours old early term female who is being exclusively BF by her mother, she's a P2 and experienced BF. She was able to BF her older child for 19 months. Baby was asleep on mom's chest STS when entering the room, offered assistance with latch but parents politely declined stating that they didn't want to disturb baby's sleep. When LC made parents aware that baby hasn't fed in 3 1/2 hours, they voiced that the MD wasn't concerned about baby's feeding pattern during the first 24 hours, that they're told that is normal for early term babies to sleep "a lot".  Explained to parents the newborn sleeping cycle and cluster feeding. They voiced understanding and will start waking her up to feed after the 24 hours mark. Per mom feedings at the breast are comfortable, despite a bad latch she had earlier today, baby left a small mark (bruised) on mom's left breast, but mom now knows how to handle it and she'll break the latch and relatch baby if feedings were to become uncomfortable. Mom didn't really pay attention to seeing or hearing swallows but she did tell LC that baby had a large spit up of amnionitic fluid mixed with colostrum earlier today (docummented on Flowsheets), baby seems to be transferring so far.  Encouraged parents to feed baby STS 8-12 times/24 or sooner if feeding cues are present. Asked parents to wake baby up for feedings if she's asleep for longer than 3 hours without cuing during the daytime. BF brochure, BF resources and feeding diary were shared, parents are aware of LC services and will call PRN.  Maternal Data Formula Feeding for Exclusion: No Has patient been taught Hand Expression?: Yes Does the patient have breastfeeding experience prior to this delivery?: Yes  Feeding Feeding Type: Breast  Fed Length of feed: 15 min  Interventions Interventions: Breast feeding basics reviewed;Coconut oil  Lactation Tools Discussed/Used Tools: Coconut oil(Mom brought her own organic nipple balm) WIC Program: No   Consult Status Consult Status: Follow-up Date: 02/16/18 Follow-up type: In-patient    Pearson Picou Venetia Constable 02/15/2018, 10:22 PM

## 2018-02-15 NOTE — Progress Notes (Signed)
Patient ID: Deanna Willis, female   DOB: 04-15-1986, 32 y.o.   MRN: [id number] [facility] Faculty Attending   PPH code iniated d/t to EBL now @ 2000 cc since TSVD A2 7:30 AM. Uncomplicated expect first degree laceration per nursing report. Pt of Dr. Claiborne Billings. She has been called and is enroute to hospital.  Upon my arrival. Pt was able to conserve without problems. Dr. Mills Koller arrived. Second IV started. Labs ordered. TXA ordered and infusion started 100 mcgs Fentanyl IV given. BP remained stable  Dr Claiborne Billings arrived. Reported provided to her and she reassumed care of pt.

## 2018-02-16 LAB — CBC
HEMATOCRIT: 27.2 % — AB (ref 36.0–46.0)
Hemoglobin: 9.3 g/dL — ABNORMAL LOW (ref 12.0–15.0)
MCH: 29.3 pg (ref 26.0–34.0)
MCHC: 34.2 g/dL (ref 30.0–36.0)
MCV: 85.8 fL (ref 78.0–100.0)
PLATELETS: 184 10*3/uL (ref 150–400)
RBC: 3.17 MIL/uL — ABNORMAL LOW (ref 3.87–5.11)
RDW: 15.1 % (ref 11.5–15.5)
WBC: 14.6 10*3/uL — AB (ref 4.0–10.5)

## 2018-02-16 NOTE — Progress Notes (Signed)
Patient is eating, ambulating, voiding.  Pain control is good.  Minimal lochia, no complaints.  Denies dizziness, N/V, CP or SOB.    Vitals:   02/15/18 1530 02/15/18 1643 02/15/18 1929 02/16/18 0431  BP: 120/83 109/68 (!) 103/58 (!) 106/52  Pulse: (!) 124 97 86 82  Resp: Temp:  99.3 F (37.4 C) 98.8 F (37.1 C) 98.3 F (36.8 C)  TempSrc:   Oral Oral  SpO2:   98%   Weight:      Height:        Fundus firm Abd: soft, NT Ext: no calf tenderness  Lab Results  Component Value Date   WBC 14.6 (H) 02/16/2018   HGB 9.3 (L) 02/16/2018   HCT 27.2 (L) 02/16/2018   MCV 85.8 02/16/2018   PLT 184 02/16/2018    --/--/A POS, A POS Performed at [facility], [address], [place], [place] [address]  (05/24 9528)  A/P Post partum day 1. Postpartum hemorrhage of estimated 3000cc- s/p 2U PRBCs, course of TXA, cytotec and methergine, vitals stable and pt feeling well, Hb stable and bleeding minimal  Routine care.  Expect d/c 5/27.    Deanna Willis

## 2018-02-17 MED ORDER — OXYCODONE-ACETAMINOPHEN 5-325 MG PO TABS: 1.0000 | ORAL_TABLET | Freq: Four times a day (QID) | ORAL | 0 refills | Status: DC | PRN

## 2018-02-17 MED ORDER — IBUPROFEN 600 MG PO TABS: 600.0000 mg | ORAL_TABLET | Freq: Four times a day (QID) | ORAL | 0 refills | Status: DC | PRN

## 2018-02-17 NOTE — Discharge Summary (Signed)
Obstetric Discharge Summary Reason for Admission: induction of labor Prenatal Procedures: NST and ultrasound Intrapartum Procedures: spontaneous vaginal delivery Postpartum Procedures: transfusion 1unit PRBCs Complications-Operative and Postpartum: hemorrhage Hemoglobin  Date Value Ref Range Status  02/16/2018 9.3 (L) 12.0 - 15.0 g/dL Final   HCT  Date Value Ref Range Status  02/16/2018 27.2 (L) 36.0 - 46.0 % Final    Physical Exam:  General: alert, cooperative and appears stated age 71: appropriate Uterine Fundus: firm DVT Evaluation: No evidence of DVT seen on physical exam.  Discharge Diagnoses: Term Pregnancy-delivered and cholestasis of pregnancy. PP hemorrhage, transfusion of 1 unit pRBCs  Discharge Information: Date: 02/17/2018 Activity: pelvic rest Diet: routine Medications: Ibuprofen and Percocet Condition: improved Instructions: refer to practice specific booklet Discharge to: home Follow-up Information    Philip Aspen, DO Follow up in 4 week(s).   Specialty:  Obstetrics and Gynecology Why:  For a postpartum evaluation Contact information: [address] [place] [address] [phone]           Newborn Data: Live born female  Birth Weight: 7 lb 7.6 oz (3391 g) APGAR: 9, 9  Newborn Delivery   Birth date/time:  02/15/2018 07:28:00 Delivery type:  Vaginal, Spontaneous     Home with mother.  Waynard Reeds 02/17/2018, 10:41 AM

## 2018-02-17 NOTE — Lactation Note (Signed)
This note was copied from a baby's chart. Lactation Consultation Note  Patient Name: Deanna Willis Date: 02/17/2018 Reason for consult: Follow-up assessment;Early term 37-38.6wks;Other (Comment)(2nd baby, experienced BF , 6% weight loss, at 24 hours Bili check 5.6 )  Baby is 49 hours old  LC reviewed doc flow sheets/ WNL for D/C  Per mom breast feeding is going well.  Mom mentioned she had plug ducts with 1st baby and mastitis.  LC reviewed preventive measures - and recommended making sure every feeding the  Baby obtains the depth with latch, and gave mom examples - start with cross cradle and  Once baby is latched switch arms to the cradle. Also football.  Sore nipple and engorgement prevention and tx reviewed. Mom has been using the coconut oil Also has  DEBP at home.  Due to the [facility] , LC recommended mom contact the LC at her [facility] office and arrange for  [facility] appt. For pre and post weight to check on milk supply.  Mother informed of post-discharge support and given phone number to the lactation department, including services for phone call assistance; out-patient appointments; and breastfeeding support group. List of other breastfeeding resources in the community given in the handout. Encouraged mother to call for problems or concerns related to breastfeeding.   Maternal Data    Feeding Feeding Type: Breast Fed Length of feed: 15 min  LATCH Score                   Interventions Interventions: Breast feeding basics reviewed  Lactation Tools Discussed/Used Tools: Coconut oil   Consult Status Consult Status: Complete Date: 02/17/18 Follow-up type: In-patient    Deanna Willis 02/17/2018, 8:47 AM

## 2018-02-18 LAB — BPAM RBC
BLOOD PRODUCT EXPIRATION DATE: 201905312359
BLOOD PRODUCT EXPIRATION DATE: 201906052359
BLOOD PRODUCT EXPIRATION DATE: 201906052359
BLOOD PRODUCT EXPIRATION DATE: 201906092359
BLOOD PRODUCT EXPIRATION DATE: 201906192359
Blood Product Expiration Date: 201906052359
Blood Product Expiration Date: 201906052359
Blood Product Expiration Date: 201906092359
Blood Product Expiration Date: 201906212359
ISSUE DATE / TIME: 201905251110
ISSUE DATE / TIME: 201905251408
ISSUE DATE / TIME: 201905260908
ISSUE DATE / TIME: 201905261509
ISSUE DATE / TIME: 201905271231
UNIT TYPE AND RH: 600
UNIT TYPE AND RH: 6200
UNIT TYPE AND RH: 6200
UNIT TYPE AND RH: 9500
Unit Type and Rh: 600
Unit Type and Rh: 6200
Unit Type and Rh: 6200
Unit Type and Rh: 6200
Unit Type and Rh: 6200

## 2018-02-18 LAB — TYPE AND SCREEN
ABO/RH(D): A POS
Antibody Screen: NEGATIVE
UNIT DIVISION: 0
UNIT DIVISION: 0
UNIT DIVISION: 0
UNIT DIVISION: 0
UNIT DIVISION: 0
Unit division: 0
Unit division: 0
Unit division: 0
Unit division: 0

## 2018-03-06 ENCOUNTER — Inpatient Hospital Stay (HOSPITAL_COMMUNITY): Admit: 2018-03-06 | Payer: BLUE CROSS/BLUE SHIELD

## 2021-07-13 ENCOUNTER — Other Ambulatory Visit: Payer: Self-pay

## 2021-07-13 ENCOUNTER — Ambulatory Visit (HOSPITAL_COMMUNITY)
Admission: EM | Admit: 2021-07-13 | Discharge: 2021-07-13 | Disposition: A | Discharge status: Home / Self Care | Payer: BLUE CROSS/BLUE SHIELD

## 2021-07-13 ENCOUNTER — Encounter (HOSPITAL_COMMUNITY): Payer: Self-pay | Admitting: Emergency Medicine

## 2021-07-13 ENCOUNTER — Emergency Department: Payer: BLUE CROSS/BLUE SHIELD

## 2021-07-13 DIAGNOSIS — M79604 Pain in right leg: Secondary | ICD-10-CM | POA: Diagnosis not present

## 2021-07-13 DIAGNOSIS — R001 Bradycardia, unspecified: Secondary | ICD-10-CM | POA: Insufficient documentation

## 2021-07-13 DIAGNOSIS — R600 Localized edema: Secondary | ICD-10-CM | POA: Diagnosis not present

## 2021-07-13 DIAGNOSIS — L52 Erythema nodosum: Secondary | ICD-10-CM | POA: Diagnosis not present

## 2021-07-13 DIAGNOSIS — R2241 Localized swelling, mass and lump, right lower limb: Secondary | ICD-10-CM | POA: Diagnosis present

## 2021-07-13 LAB — CBC WITH DIFFERENTIAL/PLATELET
Abs Immature Granulocytes: 0.03 10*3/uL (ref 0.00–0.07)
Basophils Absolute: 0 10*3/uL (ref 0.0–0.1)
Basophils Relative: 1 %
Eosinophils Absolute: 0.1 10*3/uL (ref 0.0–0.5)
Eosinophils Relative: 1 %
HCT: 38.5 % (ref 36.0–46.0)
Hemoglobin: 13.3 g/dL (ref 12.0–15.0)
Immature Granulocytes: 0 %
Lymphocytes Relative: 41 %
Lymphs Abs: 3.4 10*3/uL (ref 0.7–4.0)
MCH: 29.9 pg (ref 26.0–34.0)
MCHC: 34.5 g/dL (ref 30.0–36.0)
MCV: 86.5 fL (ref 80.0–100.0)
Monocytes Absolute: 0.5 10*3/uL (ref 0.1–1.0)
Monocytes Relative: 6 %
Neutro Abs: 4.3 10*3/uL (ref 1.7–7.7)
Neutrophils Relative %: 51 %
Platelets: 173 10*3/uL (ref 150–400)
RBC: 4.45 MIL/uL (ref 3.87–5.11)
RDW: 13.1 % (ref 11.5–15.5)
WBC: 8.3 10*3/uL (ref 4.0–10.5)
nRBC: 0 % (ref 0.0–0.2)

## 2021-07-13 LAB — COMPREHENSIVE METABOLIC PANEL
ALT: 21 U/L (ref 0–44)
AST: 25 U/L (ref 15–41)
Albumin: 4.3 g/dL (ref 3.5–5.0)
Alkaline Phosphatase: 51 U/L (ref 38–126)
Anion gap: 7 (ref 5–15)
BUN: 14 mg/dL (ref 6–20)
CO2: 25 mmol/L (ref 22–32)
Calcium: 9.3 mg/dL (ref 8.9–10.3)
Chloride: 101 mmol/L (ref 98–111)
Creatinine, Ser: 0.82 mg/dL (ref 0.44–1.00)
GFR, Estimated: >60 mL/min (ref >60)
Glucose, Bld: 123 mg/dL — ABNORMAL HIGH (ref 70–99)
Potassium: 4.3 mmol/L (ref 3.5–5.1)
Sodium: 133 mmol/L — ABNORMAL LOW (ref 135–145)
Total Bilirubin: 0.7 mg/dL (ref 0.3–1.2)
Total Protein: 8 g/dL (ref 6.5–8.1)

## 2021-07-13 NOTE — ED Provider Notes (Signed)
[facility]  ____________________________________________  Time seen: Approximately 7:34 PM  I have reviewed the triage vital signs and the nursing notes.   HISTORY  Chief Complaint Leg Pain   Historian Patient     HPI Deanna Willis is a 35 y.o. female with a history of ITP, presents to the urgent care with erythema and swelling of the right lower extremity that has progressively worsened over the past week.  Patient denies fever and chills.  She denies chest pain, chest tightness or shortness of breath.  She reports that symptoms originally started as mild erythema without swelling.  She denies a history of DVT or PE in the past.    Past Medical History:  Diagnosis Date   Anal fissure 06/02/2013   Iron deficiency anemia due to chronic blood loss 10/01/2013   Microcytic anemia in the setting of ulcerative colitis. Start iron supplements.    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     Past Medical History:  Diagnosis Date   Anal fissure 06/02/2013   Iron deficiency anemia due to chronic blood loss 10/01/2013   Microcytic anemia in the setting of ulcerative colitis. Start iron supplements.    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    TONSILLECTOMY  1997   TONSILLECTOMY      Prior to Admission medications   Medication Sig Start Date End Date Taking? Authorizing Provider  ibuprofen (ADVIL,MOTRIN) 600 MG tablet Take 1 tablet (600 mg total) by mouth every 6 (six) hours as needed. 02/17/18   Waynard Reeds, MD  inFLIXimab in sodium chloride 0.9 % Inject into the vein.    [provider]  oxyCODONE-acetaminophen (PERCOCET/ROXICET) 5-325 MG tablet Take 1-2 tablets by mouth every 6 (six) hours as needed for severe pain. Patient not taking: Reported on 07/13/2021 02/17/18   Waynard Reeds, MD  Prenatal Vit-Fe Fumarate-FA (PRENATAL MULTIVITAMIN) TABS tablet Take 1 tablet by mouth daily at 12 noon. Patient not taking: Reported on 07/13/2021    [provider]    Allergies Mesalamine er and Phenergan [promethazine hcl]  Family History  Problem Relation Age of Onset   Diabetes Father    Diabetes Paternal Actor  Heart disease Paternal Grandfather    Diabetes Maternal Grandmother    Heart disease Maternal Grandmother    Irritable bowel syndrome Paternal Grandmother    Colon cancer Neg Hx    Crohn's disease Neg Hx    Kidney disease Neg Hx    Liver disease Neg Hx     Social History Social History   Tobacco Use   Smoking status: Never   Smokeless  tobacco: Never  Vaping Use   Vaping Use: Never used  Substance Use Topics   Alcohol use: Yes    Alcohol/week: 0.0 standard drinks    Comment: OCC.; none while pregnant   Drug use: No     Review of Systems  Constitutional: No fever/chills Eyes:  No discharge ENT: No upper respiratory complaints. Respiratory: no cough. No SOB/ use of accessory muscles to breath Gastrointestinal:   No nausea, no vomiting.  No diarrhea.  No constipation. Musculoskeletal: Patient has right leg pain.  Skin: Negative for rash, abrasions, lacerations, ecchymosis.   ____________________________________________   PHYSICAL EXAM:  VITAL SIGNS: ED Triage Vitals  Enc Vitals Group     BP 07/13/21 1844 123/84     Pulse Rate 07/13/21 1844 66     Resp 07/13/21 1844 20     Temp 07/13/21 1844 99.1 F (37.3 C)     Temp Source 07/13/21 1844 Oral     SpO2 07/13/21 1844 100 %     Weight --      Height --      Head Circumference --      Peak Flow --      Pain Score 07/13/21 1841 9     Pain Loc --      Pain Edu? --      Excl. in GC? --      Constitutional: Alert and oriented. Well appearing and in no acute distress. Eyes: Conjunctivae are normal. PERRL. EOMI. Head: Atraumatic. ENT: Cardiovascular: Normal rate, regular rhythm. Normal S1 and S2.  Good peripheral circulation. Respiratory: Normal respiratory effort without tachypnea or retractions. Lungs CTAB. Good air entry to the bases with no decreased or absent breath sounds Gastrointestinal: Bowel sounds x 4 quadrants. Soft and nontender to palpation. No guarding or rigidity. No distention. Musculoskeletal: Full range of motion to all extremities. No obvious deformities noted.  Patient has 3+ pitting edema of the right ankle with erythema along the anterior aspect of the right lower leg.  Palpable dorsalis pedis pulse bilaterally and symmetrically.  Capillary refill less than 2 seconds on the right. Neurologic:  Normal for age. No gross focal neurologic  deficits are appreciated.  Skin:  Skin is warm, dry and intact. No rash noted. Psychiatric: Mood and affect are normal for age. Speech and behavior are normal.   ____________________________________________   LABS (all labs ordered are listed, but only abnormal results are displayed)  Labs Reviewed - No data to display ____________________________________________  EKG   ____________________________________________  RADIOLOGY   No results found.  ____________________________________________    PROCEDURES  Procedure(s) performed:     Procedures     Medications - No data to display   ____________________________________________   INITIAL IMPRESSION / ASSESSMENT AND PLAN / ED COURSE  Pertinent labs & imaging results that were available during my care of the patient were reviewed by me and considered in my medical decision making (see chart for details).       Assessment and plan Leg pain 35 year old female presents to the urgent care with right lower  extremity erythema and edema.  Explained to patient that she needs basic labs and ultrasound.  Patient elected to leave urgent care for emergency department that is within her network as she is concerned about receiving out of network care.    ____________________________________________  FINAL CLINICAL IMPRESSION(S) / ED DIAGNOSES  Final diagnoses:  Right leg pain      NEW MEDICATIONS STARTED DURING THIS VISIT:  ED Discharge Orders     None           This chart was dictated using voice recognition software/Dragon. Despite best efforts to proofread, errors can occur which can change the meaning. Any change was purely unintentional.     Orvil Feil, PA-C 07/13/21 1937

## 2021-07-13 NOTE — ED Triage Notes (Signed)
07/07/2021 symptoms started.  Patient has swelling, redness and painful, warm to touch.  The right lower leg is the predominant leg with issue, but reports there is a lump on left leg .  Patient inflammatory bowel disease.

## 2021-07-13 NOTE — ED Notes (Signed)
Patient is being discharged from the Urgent Care and sent to the Emergency Department via pov . Per Marlin Canary, PA, patient is in need of higher level of care due to right lower leg pain, swelling, redness, warm to touch. Patient is aware and verbalizes understanding of plan of care.  Vitals:   07/13/21 1844  BP: 123/84  Pulse: 66  Resp: 20  Temp: 99.1 F (37.3 C)  SpO2: 100%

## 2021-07-13 NOTE — ED Triage Notes (Signed)
Pt states since Friday she has had area of swelling and redness to right lower leg. Denies any known injury or cause.

## 2021-07-14 ENCOUNTER — Emergency Department
Admission: EM | Admit: 2021-07-14 | Discharge: 2021-07-14 | Disposition: A | Discharge status: Home / Self Care | Payer: BLUE CROSS/BLUE SHIELD | Attending: Emergency Medicine | Admitting: Emergency Medicine

## 2021-07-14 DIAGNOSIS — L52 Erythema nodosum: Secondary | ICD-10-CM

## 2021-07-14 DIAGNOSIS — R21 Rash and other nonspecific skin eruption: Secondary | ICD-10-CM

## 2021-07-14 DIAGNOSIS — M2550 Pain in unspecified joint: Secondary | ICD-10-CM

## 2021-07-14 DIAGNOSIS — R600 Localized edema: Secondary | ICD-10-CM

## 2021-07-14 MED ORDER — DOXYCYCLINE HYCLATE 100 MG PO CAPS: 100.0000 mg | ORAL_CAPSULE | Freq: Two times a day (BID) | ORAL | 0 refills | Status: AC

## 2021-07-14 NOTE — ED Provider Notes (Signed)
[facility] Emergency Department Provider Note  ____________________________________________   Event Date/Time   First MD Initiated Contact with Patient 07/14/21 [phone]     (approximate)  I have reviewed the triage vital signs and the nursing notes.   HISTORY  Chief Complaint Leg Swelling   HPI Deanna Willis is a 35 y.o. female with past medical history of ulcerative colitis on Remicade infusions and remote pediatric ITP idiopathic presents for assessment approximately 1 week of some redness and swelling in the right lower leg.  She states it started out as a nodule and has expanded to about 2 or 3 inches in diameter.  She states she had 1 other spot just distally and also ankles got more swollen as well.  She also noticed last day or so small nodule in the left lower leg on the inside aspect just above the ankle.  She states this is highly right-sided lesions started.  She also states over the last week her joints have been achy.  She has not had any fevers, headache, earache, sore throat, chest pain, cough, shortness of breath, abdominal pain, nausea, vomiting, diarrhea, burning with urination, blood in her urine or stool or any other areas of joint pain or redness.  She does not recall any insect bites.  She has never had any other manifestations of ulcerative colitis outside of her GI tract.  No other acute concerns at this time.         Past Medical History:  Diagnosis Date   Anal fissure 06/02/2013   Iron deficiency anemia due to chronic blood loss 10/01/2013   Microcytic anemia in the setting of ulcerative colitis. Start iron supplements.    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    TONSILLECTOMY  1997   TONSILLECTOMY      Prior to Admission medications   Medication Sig Start Date End Date Taking? Authorizing Provider  doxycycline (VIBRAMYCIN) 100 MG capsule Take 1 capsule (100 mg total) by mouth 2 (two) times daily for 10 days. 07/14/21 07/24/21 Yes Gilles Chiquito, MD  ibuprofen (ADVIL,MOTRIN) 600 MG tablet Take 1 tablet (600 mg total) by mouth every 6 (six) hours as needed. 02/17/18   Waynard Reeds, MD  inFLIXimab in sodium chloride 0.9 % Inject into the vein.    [provider]  oxyCODONE-acetaminophen (PERCOCET/ROXICET) 5-325 MG tablet Take 1-2 tablets by mouth every 6 (six) hours as needed for severe pain. Patient not taking: Reported on 07/13/2021 02/17/18   Waynard Reeds, MD  Prenatal Vit-Fe Fumarate-FA (PRENATAL MULTIVITAMIN) TABS tablet Take  1 tablet by mouth daily at 12 noon. Patient not taking: Reported on 07/13/2021    [provider]    Allergies Mesalamine er and Phenergan [promethazine hcl]  Family History  Problem Relation Age of Onset   Diabetes Father    Diabetes Paternal Grandfather    Heart disease Paternal Grandfather    Diabetes Maternal Grandmother    Heart disease Maternal Grandmother    Irritable bowel  syndrome Paternal Grandmother    Colon cancer Neg Hx    Crohn's disease Neg Hx    Kidney disease Neg Hx    Liver disease Neg Hx     Social History Social History   Tobacco Use   Smoking status: Never   Smokeless tobacco: Never  Vaping Use   Vaping Use: Never used  Substance Use Topics   Alcohol use: Yes    Alcohol/week: 0.0 standard drinks    Comment: OCC.; none while pregnant   Drug use: No    Review of Systems  Review of Systems  Constitutional:  Negative for chills and fever.  HENT:  Negative for sore throat.   Eyes:  Negative for pain.  Respiratory:  Negative for cough and stridor.   Cardiovascular:  Negative for chest pain.  Gastrointestinal:  Negative for vomiting.  Musculoskeletal:  Positive for myalgias (RLE).  Skin:  Positive for rash.  Neurological:  Negative for seizures, loss of consciousness and headaches.  Psychiatric/Behavioral:  Negative for suicidal ideas.   All other systems reviewed and are negative.    ____________________________________________   PHYSICAL EXAM:  VITAL SIGNS: ED Triage Vitals  Enc Vitals Group     BP 07/13/21 1957 (!) 141/97     Pulse Rate 07/13/21 1957 (!) 50     Resp 07/13/21 1957 20     Temp 07/13/21 1957 98.7 F (37.1 C)     Temp Source 07/13/21 1957 Oral     SpO2 07/13/21 1957 100 %     Weight 07/13/21 1958 190 lb (86.2 kg)     Height 07/13/21 1958 6\' 1"  (1.854 m)     Head Circumference --      Peak Flow --      Pain Score 07/13/21 1958 4     Pain Loc --      Pain Edu? --      Excl. in GC? --    Vitals:   07/13/21 1957  BP: (!) 141/97  Pulse: (!) 50  Resp: 20  Temp: 98.7 F (37.1 C)  SpO2: 100%   Physical Exam Vitals and nursing note reviewed.  Constitutional:      General: She is not in acute distress.    Appearance: She is well-developed.  HENT:     Head: Normocephalic and atraumatic.     Right Ear: External ear normal.     Left Ear: External ear normal.     Nose: Nose normal.  Eyes:      Conjunctiva/sclera: Conjunctivae normal.  Cardiovascular:     Rate and Rhythm: Regular rhythm. Bradycardia present.     Heart sounds: No murmur heard. Pulmonary:     Effort: Pulmonary effort is normal. No respiratory distress.     Breath sounds: Normal breath sounds.  Abdominal:     Palpations: Abdomen is soft.     Tenderness: There is no abdominal tenderness.  Musculoskeletal:     Cervical back: Neck supple.     Right lower leg: Edema present.  Skin:    General: Skin is warm  and dry.     Capillary Refill: Capillary refill takes less than 2 seconds.  Neurological:     Mental Status: She is alert and oriented to person, place, and time.  Psychiatric:        Mood and Affect: Mood normal.    There are some edema and mild tenderness about the right ankle and distal right calf.  There are 2 areas of erythema circular approximately 6 cm in diameter and 3 cm in diameter both on the distal medial calf.  No drainage or fluctuance, crepitus, streaking or surrounding skin changes.  There is approximately 0.5 cm circular nodule in the left lower calf without any other surrounding skin changes.  2+ DP pulses.  Sensation is intact light touch throughout the bilateral lower extremities.  Knees are unremarkable bilaterally.  No other areas of erythema redness. ____________________________________________   LABS (all labs ordered are listed, but only abnormal results are displayed)  Labs Reviewed  COMPREHENSIVE METABOLIC PANEL - Abnormal; Notable for the following components:      Result Value   Sodium 133 (*)    Glucose, Bld 123 (*)    All other components within normal limits  CBC WITH DIFFERENTIAL/PLATELET   ____________________________________________  EKG  ____________________________________________  RADIOLOGY  ED MD interpretation: Ultrasound of the right lower extremity shows no evidence of DVT or other acute process.  Official radiology report(s): US Venous Img Lower Unilateral  Right  Result Date: 07/13/2021 CLINICAL DATA:  Right leg pain and swelling for 1 week EXAM: RIGHT LOWER EXTREMITY VENOUS DOPPLER ULTRASOUND TECHNIQUE: Gray-scale sonography with graded compression, as well as color Doppler and duplex ultrasound were performed to evaluate the lower extremity deep venous systems from the level of the common femoral vein and including the common femoral, femoral, profunda femoral, popliteal and calf veins including the posterior tibial, peroneal and gastrocnemius veins when visible. The superficial great saphenous vein was also interrogated. Spectral Doppler was utilized to evaluate flow at rest and with distal augmentation maneuvers in the common femoral, femoral and popliteal veins. COMPARISON:  None. FINDINGS: Contralateral Common Femoral Vein: Respiratory phasicity is normal and symmetric with the symptomatic side. No evidence of thrombus. Normal compressibility. Common Femoral Vein: No evidence of thrombus. Normal compressibility, respiratory phasicity and response to augmentation. Saphenofemoral Junction: No evidence of thrombus. Normal compressibility and flow on color Doppler imaging. Profunda Femoral Vein: No evidence of thrombus. Normal compressibility and flow on color Doppler imaging. Femoral Vein: No evidence of thrombus. Normal compressibility, respiratory phasicity and response to augmentation. Popliteal Vein: No evidence of thrombus. Normal compressibility, respiratory phasicity and response to augmentation. Calf Veins: No evidence of thrombus. Normal compressibility and flow on color Doppler imaging. Superficial Great Saphenous Vein: No evidence of thrombus. Normal compressibility. Venous Reflux:  None. Other Findings:  None. IMPRESSION: No evidence of deep venous thrombosis. Electronically Signed   By: Alcide Clever M.D.   On: 07/13/2021 21:11    ____________________________________________   PROCEDURES  Procedure(s) performed (including Critical  Care):  Procedures   ____________________________________________   INITIAL IMPRESSION / ASSESSMENT AND PLAN / ED COURSE      Patient presents with above-stated history exam for assessment of approximately 1 week of some pain redness and swelling in the right lower leg 2 spots that been getting slightly more painful and worse and development of a new nodular area in the left lower leg started on the right knee side lesion started.  On arrival she is afebrile and hemodynamically stable.  On  exam she does have some circular areas of erythema that are blanchable without surrounding skin changes.  There is also some edema in the right leg.  She is otherwise neurovascular intact.  There is no history of recent trauma or infection.  Ultrasound shows no evidence of DVT.  Somewhat atypical presentation for cellulitis although given his reported some joint pains and concern for possible Lyme or RMSF or other tickborne illness versus atypical cellulitis versus erythema nodosum from underlying ulcerative colitis.  She has no historical or exam features to suggest an acute pharyngitis, nephritis or pulmonary pathology.  Discussed at length my concerns with patient and at this time she will follow-up with dermatology and discussed with her GI physician as well.  Will defer NSAIDs given history of ulcerative colitis.  We will also defer initiation of potassium iodide pending evaluation with dermatology and response to doxycycline which will cover for possible tickborne illness and cellulitis.  Discharged stable condition.  Strict return precautions advised and discussed.       ____________________________________________   FINAL CLINICAL IMPRESSION(S) / ED DIAGNOSES  Final diagnoses:  Leg edema  Rash  Erythema nodosum  Arthralgia, unspecified joint    Medications - No data to display   ED Discharge Orders          Ordered    doxycycline (VIBRAMYCIN) 100 MG capsule  2 times daily         07/14/21 0406             Note:  This document was prepared using Dragon voice recognition software and may include unintentional dictation errors.    Gilles Chiquito, MD 07/14/21 [id number]

## 2021-09-24 NOTE — L&D Delivery Note (Addendum)
   Delivery Note:   [id number] at [redacted]w[redacted]d  Admitting diagnosis: Indication for care in labor or delivery [O75.9] Risks: Hx ICP with G2, IOL at 37 wks, pelvis proven 7'7". Normal LFT's and bile salts this pregnancy, not symptomatic Hx Colitis in remission, used Remicade and had last infusion during early 2nd trimester.  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")  First Stage:  Induction of labor:membrane sweep x 2, castor oil 8/2, AROM and oral Cytotec at 10 am 04/26/22 Onset of labor: 1000 04/26/2022  Augmentation: AROM and Cytotec ROM: 1015 04/26/2022 clear fluid, large amount Active labor onset: 1400 04/26/2022  Hydrotherapy: tub time start 1430 until 1630 Analgesia /Anesthesia/Pain control intrapartum: nitrous gas and hydrotherapy  Second Stage:  Complete dilation at 04/26/2022  1715 Onset of pushing at 1720 FHR second stage category 1   Pushing on toilet, then HK on bed, turned in R lateral position with CNM and L&D staff support, Deanna Willis present for birth and supportive. Nuchal Cord: No  Delivery of a Live born female  Birth Weight:  4380 g Weight:, English: 9 lb 10.5 oz APGAR: 9, 9  Newborn Delivery   Birth date/time: 04/26/2022 17:57:00 Delivery type: vaginal, spontaneous      in cephalic presentation, position OA to ROT. Shoulders and body delivered with ease, Deanna Willis assisting with delivery, announced baby sex and placed on mother's chest.  Newborn with strong cry at birth, well flexed tone, dried and stimulated, placed on maternal abdomen.  Cord double clamped after cessation of pulsation, cut by Deanna Willis.  Collection of cord blood for typing completed. Cord blood donation- no Arterial cord blood  sample-  no  Third Stage:  Placenta delivered 04/26/2022 at 1804-Spontaneous  with  3VC. Uterine tone firm, bleeding small after massage. Uterotonics: Pitocin bolus after baby delivery Placenta to L&D for disposal.    1st deg perineal laceration identified. Hemostatic, patient deferred repair Episiotomy:None  Local analgesia: none  Repair:none Est. Blood Loss (mL):400 Complications: None   Mom to postpartum.  Baby Deanna Willis to Couplet care / Skin to Skin.  Delivery Report:  Review the Delivery Report for details.     Signed: Neta Mends, CNM, MSN 04/26/2022, 6:23 PM

## 2021-10-11 LAB — OB RESULTS CONSOLE GC/CHLAMYDIA
Chlamydia: NEGATIVE
Neisseria Gonorrhea: NEGATIVE

## 2021-11-07 LAB — OB RESULTS CONSOLE HEPATITIS B SURFACE ANTIGEN: Hepatitis B Surface Ag: NEGATIVE

## 2021-11-07 LAB — OB RESULTS CONSOLE RPR: RPR: NONREACTIVE

## 2021-11-07 LAB — OB RESULTS CONSOLE HIV ANTIBODY (ROUTINE TESTING): HIV: NONREACTIVE

## 2021-11-07 LAB — OB RESULTS CONSOLE RUBELLA ANTIBODY, IGM: Rubella: IMMUNE

## 2022-03-08 ENCOUNTER — Other Ambulatory Visit: Payer: Self-pay | Admitting: Obstetrics and Gynecology

## 2022-03-08 DIAGNOSIS — O99013 Anemia complicating pregnancy, third trimester: Secondary | ICD-10-CM

## 2022-03-14 ENCOUNTER — Non-Acute Institutional Stay (HOSPITAL_COMMUNITY)
Admission: RE | Admit: 2022-03-14 | Discharge: 2022-03-14 | Disposition: A | Discharge status: Home / Self Care | Payer: BC Managed Care – PPO | Source: Ambulatory Visit | Attending: Internal Medicine | Admitting: Internal Medicine

## 2022-03-14 DIAGNOSIS — O99013 Anemia complicating pregnancy, third trimester: Secondary | ICD-10-CM | POA: Insufficient documentation

## 2022-03-14 MED ORDER — SODIUM CHLORIDE 0.9 % IV SOLN: INTRAVENOUS | Status: DC | PRN

## 2022-03-14 MED ORDER — ACETAMINOPHEN 500 MG PO TABS
500.0000 mg | ORAL_TABLET | ORAL | Status: DC
  Administered 2022-03-14: 500 mg via ORAL
  Filled 2022-03-14: qty 1

## 2022-03-14 MED ORDER — SODIUM CHLORIDE 0.9 % IV SOLN
500.0000 mg | INTRAVENOUS | Status: DC
  Administered 2022-03-14: 500 mg via INTRAVENOUS
  Filled 2022-03-14: qty 25

## 2022-03-14 MED ORDER — DIPHENHYDRAMINE HCL 25 MG PO CAPS
25.0000 mg | ORAL_CAPSULE | ORAL | Status: DC
  Administered 2022-03-14: 25 mg via ORAL
  Filled 2022-03-14: qty 1

## 2022-03-14 NOTE — Progress Notes (Signed)
PATIENT CARE CENTER NOTE   Diagnosis: Anemia complicating pregnancy in third trimester [O99.013]    Provider: Karena Addison, CNM   Procedure: Venofer infusion    Note:  Patient received Venofer 500 mg infusion (dose # 1 of 2) via PIV. Pre-medications (Tylenol and Benadryl) given per order. Patient tolerated infusion well with no adverse reaction. Vital signs stable. Discharge instructions given. Patient to come back in 2 weeks for next infusion and will schedule appointment at the front desk. Patient alert, oriented and ambulatory at discharge.

## 2022-03-26 ENCOUNTER — Encounter (HOSPITAL_COMMUNITY): Payer: Self-pay | Admitting: *Deleted

## 2022-03-26 ENCOUNTER — Inpatient Hospital Stay (HOSPITAL_COMMUNITY)
Admission: AD | Admit: 2022-03-26 | Discharge: 2022-03-26 | Disposition: A | Discharge status: Home / Self Care | Payer: BC Managed Care – PPO | Attending: Obstetrics & Gynecology | Admitting: Obstetrics & Gynecology

## 2022-03-26 ENCOUNTER — Inpatient Hospital Stay (HOSPITAL_BASED_OUTPATIENT_CLINIC_OR_DEPARTMENT_OTHER): Payer: BC Managed Care – PPO

## 2022-03-26 DIAGNOSIS — O368320 Maternal care for abnormalities of the fetal heart rate or rhythm, second trimester, not applicable or unspecified: Secondary | ICD-10-CM | POA: Diagnosis not present

## 2022-03-26 DIAGNOSIS — O99113 Other diseases of the blood and blood-forming organs and certain disorders involving the immune mechanism complicating pregnancy, third trimester: Secondary | ICD-10-CM | POA: Diagnosis present

## 2022-03-26 DIAGNOSIS — O368330 Maternal care for abnormalities of the fetal heart rate or rhythm, third trimester, not applicable or unspecified: Secondary | ICD-10-CM | POA: Insufficient documentation

## 2022-03-26 DIAGNOSIS — Z3A35 35 weeks gestation of pregnancy: Secondary | ICD-10-CM

## 2022-03-26 DIAGNOSIS — Z3490 Encounter for supervision of normal pregnancy, unspecified, unspecified trimester: Secondary | ICD-10-CM | POA: Diagnosis present

## 2022-03-26 DIAGNOSIS — O99013 Anemia complicating pregnancy, third trimester: Secondary | ICD-10-CM | POA: Insufficient documentation

## 2022-03-26 HISTORY — DX: Ulcerative (chronic) pancolitis without complications: K51.00

## 2022-03-26 LAB — OB RESULTS CONSOLE GBS: GBS: NEGATIVE

## 2022-03-26 NOTE — MAU Note (Signed)
Deanna Willis is a 36 y.o. at [redacted]w[redacted]d here in MAU reporting: was at regular appt, FH was lower than it has been.  Hooked up for NST, FH was lower.  Sent in for [place] (BPP- CNM called will be seeing pt), and prolonged monitoring. No pain. No bleeding or leaking. Reports +FM  Onset of complaint: this morning Pain score: 0 Vitals:   03/26/22 1154  BP: 126/82  Pulse: 81  Resp: 18  Temp: 98.5 F (36.9 C)  SpO2: 99%     FHT:118 Lab orders placed from triage:  none

## 2022-03-26 NOTE — MAU Provider Note (Signed)
History   Deanna Willis is a 36 y.o. G3P2002 at [redacted]w[redacted]d here for evaluation of low fetal baseline noted during routine prenatal visit in office today.  Patient notes normal fetal activity until today, denies LOF/VB, no abdominal or pelvic pain.   Prenatal course complicated by hx ICP with previous pregnancy and was induced at 37 wks, PPH. This pregnancy she has been ICP symptoms free and baseline LFT's in 2nd trimester were wnl.  Chrone's dz in remission, had last Remicade infusion 2 weeks ago.  Anemia of pregnancy not responsive to oral therapy, Hgb nadir 9.9, s/p IV iron x 2 doses.   Here with spouse Greggory Stallion, supportive.   CSN: [id number]  Arrival date and time: 03/26/22 1138   None     Chief Complaint  Patient presents with   low fetal heart baseline   HPI  OB History     Gravida  3   Para  2   Term  2   Preterm      AB      Living  2      SAB      IAB      Ectopic      Multiple  0   Live Births  2           Past Medical History:  Diagnosis Date   Anal fissure 06/02/2013   Iron deficiency anemia due to chronic blood loss 10/01/2013   Microcytic anemia in the setting of ulcerative colitis. Start iron supplements.    ITP (idiopathic thrombocytopenic purpura)    last flare-up 2008; Question of associated tendency towards von Willebrand   Left sided ulcerative (chronic) colitis w/ appendiceal inflammaition 03/11/2013   Menorrhagia    Associated with thrombocytopenia, 2 episodes in teenage years responded the platelet infusion   Status post vacuum-assisted vaginal delivery 05/26/2014   Ulcerative (chronic) enterocolitis (HCC)    Vitamin D deficiency 01/28/2015    Past Surgical History:  Procedure Laterality Date   COLONOSCOPY W/ BIOPSIES  03/24/2013   FLEXIBLE SIGMOIDOSCOPY N/A 04/06/2014   Procedure: FLEXIBLE SIGMOIDOSCOPY;  Surgeon: Louis Meckel, MD;  Location: [facility] ENDOSCOPY;  Service: Endoscopy;  Laterality: N/A;  patient pregnant [redacted] weeks.     TONSILLECTOMY  1997   TONSILLECTOMY      Family History  Problem Relation Age of Onset   Diabetes Father    Diabetes Paternal Grandfather    Heart disease Paternal Grandfather    Diabetes Maternal Grandmother    Heart disease Maternal Grandmother    Irritable bowel syndrome Paternal Grandmother    Colon cancer Neg Hx    Crohn's disease Neg Hx    Kidney disease Neg Hx    Liver disease Neg Hx     Social History   Tobacco Use   Smoking status: Never   Smokeless tobacco: Never  Vaping Use   Vaping Use: Never used  Substance Use Topics   Alcohol use: Yes    Alcohol/week: 0.0 standard drinks of alcohol    Comment: OCC.; none while pregnant   Drug use: No    Allergies:  Allergies  Allergen Reactions   Mesalamine Er Other (See Comments)    Tablet form worsens colitis symptoms   Phenergan [Promethazine Hcl] Other (See Comments)    Fainting, syncope    Medications Prior to Admission  Medication Sig Dispense Refill Last Dose   inFLIXimab in sodium chloride 0.9 % Inject into the vein.   Past Month   Prenatal MV &  Min w/FA-DHA (PRENATAL GUMMIES PO) Take 3 each by mouth daily.   03/25/2022    Review of Systems As noted above  Physical Exam  Gen: AAO x 3, NAD Abd: gravid, NT, S=D GU deferred Ext: no edema, no cords or tenderness  Blood pressure 119/79, pulse 71, temperature 98.5 F (36.9 C), temperature source Oral, resp. rate 18, height 6\' 1"  (1.854 m), weight 110 kg, last menstrual period 06/20/2021, SpO2 100 %, unknown if currently breastfeeding.  FHT: BL 110-120, moderate variability, no decels, good 15x15 accels, NST reactive  MAU Course  Procedures  MDM EFM extended, NST reactive BPP 6/8, 2 off for no FBM  Assessment and Plan  G3P2002 at [redacted]w[redacted]d, hx Chrone's in remission/Remicade tx completed 2 wks ago, hx ICP in prior pregnancy, anemia of pregnancy, s/p IV iron x 2  Low fetal baseline, unclear etiology Improved to low normal during MAU observation and  reassuring ANFT Discussed findings with patient and spouse, fetal testing overall reassuring.  Will discharge home with fetal movement counts daily and f/u in office end of this week for repeat fetal testing Patient agreeable with plan  POC in consult w/ Dr. [redacted]w[redacted]d, CNM 03/26/2022, 1:58 PM

## 2022-03-29 ENCOUNTER — Non-Acute Institutional Stay (HOSPITAL_COMMUNITY)
Admission: RE | Admit: 2022-03-29 | Discharge: 2022-03-29 | Disposition: A | Discharge status: Home / Self Care | Payer: BC Managed Care – PPO | Source: Ambulatory Visit | Attending: Internal Medicine | Admitting: Internal Medicine

## 2022-03-29 DIAGNOSIS — O99013 Anemia complicating pregnancy, third trimester: Secondary | ICD-10-CM | POA: Insufficient documentation

## 2022-03-29 MED ORDER — ACETAMINOPHEN 500 MG PO TABS
500.0000 mg | ORAL_TABLET | Freq: Once | ORAL | Status: DC
  Filled 2022-03-29: qty 1

## 2022-03-29 MED ORDER — SODIUM CHLORIDE 0.9 % IV SOLN
500.0000 mg | Freq: Once | INTRAVENOUS | Status: AC
  Administered 2022-03-29: 500 mg via INTRAVENOUS
  Filled 2022-03-29: qty 25

## 2022-03-29 MED ORDER — DIPHENHYDRAMINE HCL 25 MG PO CAPS
25.0000 mg | ORAL_CAPSULE | Freq: Once | ORAL | Status: DC
  Filled 2022-03-29: qty 1

## 2022-03-29 MED ORDER — SODIUM CHLORIDE 0.9 % IV SOLN: INTRAVENOUS | Status: DC | PRN

## 2022-03-29 NOTE — Progress Notes (Signed)
PATIENT CARE CENTER NOTE     Diagnosis: Anemia complicating pregnancy in third trimester [O99.013]     Provider: Karena Addison, CNM     Procedure: Venofer infusion      Note:  Patient received Venofer 500 mg infusion (dose # 2 of 2) via PIV. Patient had pre-medicated with Tylenol and Benadryl prior to arrival. Patient tolerated infusion well with no adverse reaction. Vital signs stable. AVS offered but patient refused.  Patient alert, oriented and ambulatory at discharge.

## 2022-04-26 ENCOUNTER — Inpatient Hospital Stay (HOSPITAL_COMMUNITY)
Admission: AD | Admit: 2022-04-26 | Discharge: 2022-04-28 | DRG: 806 | Disposition: A | Discharge status: Home / Self Care | Payer: BC Managed Care – PPO | Attending: Obstetrics and Gynecology | Admitting: Obstetrics and Gynecology

## 2022-04-26 ENCOUNTER — Other Ambulatory Visit: Payer: Self-pay

## 2022-04-26 ENCOUNTER — Encounter (HOSPITAL_COMMUNITY): Payer: Self-pay | Admitting: Obstetrics and Gynecology

## 2022-04-26 DIAGNOSIS — O99891 Other specified diseases and conditions complicating pregnancy: Secondary | ICD-10-CM

## 2022-04-26 DIAGNOSIS — O9962 Diseases of the digestive system complicating childbirth: Secondary | ICD-10-CM | POA: Diagnosis present

## 2022-04-26 DIAGNOSIS — Z8616 Personal history of COVID-19: Secondary | ICD-10-CM | POA: Diagnosis not present

## 2022-04-26 DIAGNOSIS — K5190 Ulcerative colitis, unspecified, without complications: Secondary | ICD-10-CM | POA: Diagnosis present

## 2022-04-26 DIAGNOSIS — Z8759 Personal history of other complications of pregnancy, childbirth and the puerperium: Secondary | ICD-10-CM

## 2022-04-26 DIAGNOSIS — K5150 Left sided colitis without complications: Secondary | ICD-10-CM | POA: Diagnosis present

## 2022-04-26 DIAGNOSIS — O99019 Anemia complicating pregnancy, unspecified trimester: Secondary | ICD-10-CM | POA: Diagnosis present

## 2022-04-26 DIAGNOSIS — Z3A37 37 weeks gestation of pregnancy: Secondary | ICD-10-CM

## 2022-04-26 DIAGNOSIS — O3663X0 Maternal care for excessive fetal growth, third trimester, not applicable or unspecified: Secondary | ICD-10-CM | POA: Diagnosis present

## 2022-04-26 DIAGNOSIS — Z8659 Personal history of other mental and behavioral disorders: Secondary | ICD-10-CM

## 2022-04-26 DIAGNOSIS — O9902 Anemia complicating childbirth: Secondary | ICD-10-CM | POA: Diagnosis present

## 2022-04-26 LAB — CBC
HCT: 38.2 % (ref 36.0–46.0)
Hemoglobin: 12.9 g/dL (ref 12.0–15.0)
MCH: 29.2 pg (ref 26.0–34.0)
MCHC: 33.8 g/dL (ref 30.0–36.0)
MCV: 86.4 fL (ref 80.0–100.0)
Platelets: 160 10*3/uL (ref 150–400)
RBC: 4.42 MIL/uL (ref 3.87–5.11)
RDW: 15.2 % (ref 11.5–15.5)
WBC: 10.3 10*3/uL (ref 4.0–10.5)
nRBC: 0 % (ref 0.0–0.2)

## 2022-04-26 LAB — TYPE AND SCREEN
ABO/RH(D): A POS
Antibody Screen: NEGATIVE

## 2022-04-26 LAB — RPR: RPR Ser Ql: NONREACTIVE

## 2022-04-26 MED ORDER — PRENATAL MULTIVITAMIN CH
1.0000 | ORAL_TABLET | Freq: Every day | ORAL | Status: DC
Start: 2022-04-27 — End: 2022-04-28
  Filled 2022-04-26: qty 1

## 2022-04-26 MED ORDER — OXYTOCIN-SODIUM CHLORIDE 30-0.9 UT/500ML-% IV SOLN
2.5000 [IU]/h | INTRAVENOUS | Status: DC
Start: 2022-04-26 — End: 2022-04-26
  Administered 2022-04-26: 2.5 [IU]/h via INTRAVENOUS
  Filled 2022-04-26: qty 500

## 2022-04-26 MED ORDER — OXYTOCIN BOLUS FROM INFUSION
333.0000 mL | Freq: Once | INTRAVENOUS | Status: AC
  Administered 2022-04-26: 333 mL via INTRAVENOUS

## 2022-04-26 MED ORDER — ONDANSETRON HCL 4 MG/2ML IJ SOLN: 4.0000 mg | INTRAMUSCULAR | Status: DC | PRN

## 2022-04-26 MED ORDER — WITCH HAZEL-GLYCERIN EX PADS: 1.0000 | MEDICATED_PAD | CUTANEOUS | Status: DC | PRN

## 2022-04-26 MED ORDER — BISACODYL 10 MG RE SUPP: 10.0000 mg | Freq: Every day | RECTAL | Status: DC | PRN

## 2022-04-26 MED ORDER — LIDOCAINE HCL (PF) 1 % IJ SOLN: 30.0000 mL | INTRAMUSCULAR | Status: DC | PRN

## 2022-04-26 MED ORDER — ZOLPIDEM TARTRATE 5 MG PO TABS: 5.0000 mg | ORAL_TABLET | Freq: Every evening | ORAL | Status: DC | PRN

## 2022-04-26 MED ORDER — DIPHENHYDRAMINE HCL 25 MG PO CAPS: 25.0000 mg | ORAL_CAPSULE | Freq: Four times a day (QID) | ORAL | Status: DC | PRN

## 2022-04-26 MED ORDER — ACETAMINOPHEN 500 MG PO TABS
1000.0000 mg | ORAL_TABLET | Freq: Four times a day (QID) | ORAL | Status: DC
  Administered 2022-04-26 – 2022-04-28 (×6): 1000 mg via ORAL
  Filled 2022-04-26 (×7): qty 2

## 2022-04-26 MED ORDER — FAMOTIDINE IN NACL 20-0.9 MG/50ML-% IV SOLN: 20.0000 mg | Freq: Two times a day (BID) | INTRAVENOUS | Status: DC | PRN

## 2022-04-26 MED ORDER — SENNOSIDES-DOCUSATE SODIUM 8.6-50 MG PO TABS
2.0000 | ORAL_TABLET | ORAL | Status: DC
  Administered 2022-04-27 – 2022-04-28 (×2): 2 via ORAL
  Filled 2022-04-26 (×3): qty 2

## 2022-04-26 MED ORDER — METHYLERGONOVINE MALEATE 0.2 MG/ML IJ SOLN: 0.2000 mg | INTRAMUSCULAR | Status: DC | PRN

## 2022-04-26 MED ORDER — ACETAMINOPHEN 325 MG PO TABS: 650.0000 mg | ORAL_TABLET | ORAL | Status: DC | PRN

## 2022-04-26 MED ORDER — SOD CITRATE-CITRIC ACID 500-334 MG/5ML PO SOLN: 30.0000 mL | ORAL | Status: DC | PRN

## 2022-04-26 MED ORDER — MISOPROSTOL 50MCG HALF TABLET
50.0000 ug | ORAL_TABLET | ORAL | Status: DC
  Administered 2022-04-26: 50 ug via BUCCAL
  Filled 2022-04-26: qty 1

## 2022-04-26 MED ORDER — LACTATED RINGERS IV SOLN: 500.0000 mL | INTRAVENOUS | Status: DC | PRN

## 2022-04-26 MED ORDER — SIMETHICONE 80 MG PO CHEW: 80.0000 mg | CHEWABLE_TABLET | ORAL | Status: DC | PRN

## 2022-04-26 MED ORDER — TETANUS-DIPHTH-ACELL PERTUSSIS 5-2.5-18.5 LF-MCG/0.5 IM SUSY: 0.5000 mL | PREFILLED_SYRINGE | Freq: Once | INTRAMUSCULAR | Status: DC

## 2022-04-26 MED ORDER — TERBUTALINE SULFATE 1 MG/ML IJ SOLN: 0.2500 mg | Freq: Once | INTRAMUSCULAR | Status: DC | PRN

## 2022-04-26 MED ORDER — OXYCODONE-ACETAMINOPHEN 5-325 MG PO TABS: 2.0000 | ORAL_TABLET | ORAL | Status: DC | PRN

## 2022-04-26 MED ORDER — LACTATED RINGERS IV SOLN: INTRAVENOUS | Status: DC

## 2022-04-26 MED ORDER — METHYLERGONOVINE MALEATE 0.2 MG PO TABS: 0.2000 mg | ORAL_TABLET | ORAL | Status: DC | PRN

## 2022-04-26 MED ORDER — FLEET ENEMA 7-19 GM/118ML RE ENEM: 1.0000 | ENEMA | Freq: Every day | RECTAL | Status: DC | PRN

## 2022-04-26 MED ORDER — ONDANSETRON HCL 4 MG/2ML IJ SOLN: 4.0000 mg | Freq: Four times a day (QID) | INTRAMUSCULAR | Status: DC | PRN

## 2022-04-26 MED ORDER — COCONUT OIL OIL: 1.0000 | TOPICAL_OIL | Status: DC | PRN

## 2022-04-26 MED ORDER — DIBUCAINE (PERIANAL) 1 % EX OINT: 1.0000 | TOPICAL_OINTMENT | CUTANEOUS | Status: DC | PRN

## 2022-04-26 MED ORDER — ONDANSETRON HCL 4 MG PO TABS: 4.0000 mg | ORAL_TABLET | ORAL | Status: DC | PRN

## 2022-04-26 MED ORDER — OXYCODONE-ACETAMINOPHEN 5-325 MG PO TABS: 1.0000 | ORAL_TABLET | ORAL | Status: DC | PRN

## 2022-04-26 MED ORDER — BENZOCAINE-MENTHOL 20-0.5 % EX AERO
1.0000 | INHALATION_SPRAY | CUTANEOUS | Status: DC | PRN
  Administered 2022-04-27: 1 via TOPICAL
  Filled 2022-04-26: qty 56

## 2022-04-26 NOTE — Progress Notes (Signed)
S: Doing well, coping with contraction work well, used water tub for past 2 hours with good pain relief. Deanna Willis present and supporting.   O: Vitals:   04/26/22 0944 04/26/22 1229 04/26/22 1523 04/26/22 1618  BP: 136/75 119/68 112/66   Pulse: 87 86 75   Resp: 18     Temp: 98.1 F (36.7 C) 97.7 F (36.5 C)  97.7 F (36.5 C)  TempSrc: Oral Axillary  Oral  SpO2: 100%     Weight: 109.5 kg     Height: 6\' 1"  (1.854 m)        FHT: intermittent EFM FHR: 120 bpm, variability: moderate,  accelerations:  Present,  decelerations:  Absent UC:   regular, every 2-3 minutes SVE:   Dilation: 9 Effacement (%): 100 Station: -1 Exam by:: Melania Kirks, CNM Vertex, transverse  A / P: Spontaneous labor, progressing normally Out of tub for void and then in L exaggerated Sims for fetal rotation and descent.  Will keep working with position changes to facilitate progress.  Anticipate active second stage soon. Expecting LGA, Dr. [email or web address] updated with patient status.  Fetal Wellbeing:  Category I Pain Control:  Water tub  Anticipated MOD:   cautious for NSVB  Deanna Willis, CNM, MSN 04/26/2022, 4:56 PM

## 2022-04-26 NOTE — Progress Notes (Signed)
                                                                                                                                                                                                                                                                                                                       Deanna Willis is a 36 y.o. G3P2002 at [redacted]w[redacted]d admitted for Indication for care in labor or delivery [O75.9]  Subjective: Laboring in various positions, using BB at [facility]. Greggory Stallion providing labor support.  Reports contractions becoming intense, would like to get in the tub now.   Objective: Vitals:   04/26/22 0944 04/26/22 1229  BP: 136/75 119/68  Pulse: 87 86  Resp: 18   Temp: 98.1 F (36.7 C) 97.7 F (36.5 C)  TempSrc: Oral Axillary  SpO2: 100%   Weight: 109.5 kg   Height: 6\' 1"  (1.854 m)     No intake/output data recorded. No intake/output data recorded.   FHT: Intermittent EFM  FHR: 125 bpm, variability: moderate,  accelerations:  Present,  decelerations:  Absent UC:   regular, every 4 minutes SVE:   Dilation: 6 Effacement (%): 80 Station: -1 Exam by:: [email or web address], CNM  Labs:   Recent Labs    04/26/22 0959  WBC 10.3  HGB 12.9  HCT 38.2  PLT 160    Assessment / Plan: 06/26/22 36 y.o. [redacted]w[redacted]d Augmentation of labor, progressing well after one dose cytotec and AROM  Labor: anticipate active stage, Pitocin PRN Preeclampsia:  no signs or symptoms of toxicity Fetal Wellbeing:  Category I Pain Control:  Water tub now, prep room I/D:   GBS neg  Anticipated MOD:   guarded for NSVB  [redacted]w[redacted]d, CNM, MSN 04/26/2022, 2:20 PM

## 2022-04-26 NOTE — Lactation Note (Signed)
This note was copied from a baby's chart. Lactation Consultation Note  Patient Name: Deanna Willis [id number] Date: 04/26/2022 Reason for consult: Initial assessment;Term Age:34 hours  Breast feeding parent was BF when LC came into rm. Breast feeding parent states baby is BF a lot and denies painful latch. Hearing swallows. Experienced BF mom BF her other 2 children for 1 1/2 yrs each. Mom had no issues w/milk supply w/her other 2 children. Newborn feeding habits reviewed. Mom encouraged to feed baby 8-12 times/24 hours and with feeding cues.   Mom had no questions or concerns at this time. Praised mom for good BF. Encouraged to call for assistance as needed.  Maternal Data Has patient been taught Hand Expression?: No Does the patient have breastfeeding experience prior to this delivery?: Yes How long did the patient breastfeed?: 1 1/2 yr each. mom has 27 and 69 yr old  Feeding Mother's Current Feeding Choice: Breast Milk  LATCH Score Latch: Grasps breast easily, tongue down, lips flanged, rhythmical sucking.  Audible Swallowing: Spontaneous and intermittent  Type of Nipple: Everted at rest and after stimulation  Comfort (Breast/Nipple): Soft / non-tender  Hold (Positioning): No assistance needed to correctly position infant at breast.  LATCH Score: 10   Lactation Tools Discussed/Used    Interventions Interventions: Breast feeding basics reviewed;Skin to skin;LC Services brochure  Discharge Pump: Personal  Consult Status Consult Status: Follow-up Date: 04/27/22 Follow-up type: In-patient    Charyl Dancer 04/26/2022, 10:26 PM

## 2022-04-26 NOTE — H&P (Signed)
OB ADMISSION/ HISTORY & PHYSICAL:  Admission Date: 04/26/2022  9:19 AM  Admit Diagnosis: Indication for care in labor or delivery [O75.9]    Deanna Willis is a 36 y.o. female presenting for IOL at term, LGA 4600gm. Membrane sweep 2 days ago, then took castor oil to stimulate natural labor yesterday, had some uterine activity throughout the day but was able to sleep through the night. No LOF or VB. Reports good FM.  Spouse Greggory Stallion present and supportive. Desires waterbirth, class completed.  Expecting surprise baby, F[place]on Mole or Maisie Fus.  Prenatal History: [id number]   EDC : 04/26/2022 Prenatal care at [facility] since 1st trim, primary Sigmon, CNM   Prenatal course complicated by: Hx ICP with G2, IOL at 37 wks, pelvis proven 7'7". Normal LFT's and bile salts this pregnancy, not symptomatic Hx Colitis in remission, used Remicade and had last infusion during early 2nd trimester.  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 Start iron supplements.    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 03/11/2013   Menorrhagia    Associated with thrombocytopenia, 2 episodes in teenage years responded the platelet infusion   Status post vacuum-assisted vaginal delivery 05/26/2014   Ulcerative (chronic) enterocolitis (HCC)    Vitamin D deficiency 01/28/2015     Past surgical history:  Past Surgical History:  Procedure Laterality Date   COLONOSCOPY W/ BIOPSIES  03/24/2013   FLEXIBLE SIGMOIDOSCOPY N/A 04/06/2014   Procedure: FLEXIBLE SIGMOIDOSCOPY;  Surgeon: Louis Meckel, MD;  Location: [facility] ENDOSCOPY;  Service: Endoscopy;  Laterality: N/A;  patient pregnant [redacted] weeks.    TONSILLECTOMY  1997   TONSILLECTOMY       Family History:  Family History  Problem Relation Age of Onset   Diabetes Father    Diabetes Paternal Grandfather    Heart disease Paternal Grandfather    Diabetes Maternal Grandmother    Heart disease Maternal Grandmother    Irritable bowel syndrome Paternal Grandmother    Colon cancer Neg Hx    Crohn's disease Neg Hx    Kidney disease Neg Hx    Liver disease Neg Hx      Social History:  reports that she has never smoked. She has never used smokeless tobacco. She reports current alcohol use. She reports that she does not use drugs.  Allergies: Mesalamine er and Phenergan [promethazine hcl]   Current Medications at time of admission:  Medications Prior to  Admission  Medication Sig Dispense Refill Last Dose   Prenatal MV & Min w/FA-DHA (PRENATAL GUMMIES PO) Take 3 each by mouth daily.   04/25/2022   inFLIXimab in sodium chloride 0.9 % Inject into the vein.      Oral Fe, magnesium  Review of Systems: ROS As noted above.  Physical Exam: Vital signs and nursing notes reviewed.  Patient Vitals for the past 24 hrs:  BP Pulse Height Weight  04/26/22 0944 136/75 87 6\' 1"  (1.854 m) 109.5 kg     General: AAO x 3, NAD, coping well Heart: RRR Lungs:CTAB Abdomen: Gravid, NT, Leopold's vertex Extremities: trace  pedal edema Genitalia / VE: Dilation: 4.5 Effacement (%): 80 Station: -2 Presentation: Vertex Exam by:: [email or web address], CNM  AROM with patient consent, clear fluid, large amount. Vertex well applied. Abdominal binder placed for laxity  FHR: 125 BPM, moderate variability, + accels, no decels TOCO: Ctx occasional  Labs:   Pending T&S, CBC, RPR  Recent Labs    04/26/22 0959  WBC 10.3  HGB 12.9  HCT 38.2  PLT 160     Assessment/Plan:  36 y.o. G3P2002 at [redacted]w[redacted]d Hx colitis in remission, anemia, LGA, hx PPD, hx ICP  Fetal wellbeing - FHT category 1 EFW 9-10 lbs, LGA Pelvis proven 7"7"  Labor: latency since yesterday, will augment with AROM and buccal Cytotec 50 mcg. Pitocin PRN until in active labor.  Large fetus, watch closely for arrest/labor dystocia  GBS neg Rubella imm Rh pos  Pain control: desires unmedicated/hydrotherapy, may use tub for labor, likely land birth with increased risk of shoulder dystocia Analgesia/anesthesia PRN  Anticipated MOD: cautious for NSVB  Plans to breastfeed, plan circumcision for boy POC discussed with patient and support team, all questions answered.  Dr [redacted]w[redacted]d notified of admission / plan of care   Conni Elliot CNM, MSN 04/26/2022, 11:48 AM

## 2022-04-26 NOTE — Lactation Note (Signed)
This note was copied from a baby's chart. Lactation Consultation Note  Patient Name: Deanna Willis Date: 04/26/2022 Reason for consult: L&D Initial assessment;Term Age:35 hours  Maternal Data Has patient been taught Hand Expression?: No Does the patient have breastfeeding experience prior to this delivery?: Yes How long did the patient breastfeed?: BF her now 63 and 34 year old for 15 months,  Parent holding infant STS.Marland Kitchen  Infant rooting and attempting to latch in cradle hold.  Baby unable to grasp breast easily.  Multiple attempts.  LC assisted and baby latched with several sucks but then would come off; unable to relatch.  LC placed baby in football hold and baby latched easier and sustained latch well.  Parent denied pain.   LC encouraged STS and feeding with cues.    Parent shared that previous child has lip and tongue tie revised at 3 months.     Feeding Mother's Current Feeding Choice: Breast Milk  LATCH Score Latch: Repeated attempts needed to sustain latch, nipple held in mouth throughout feeding, stimulation needed to elicit sucking reflex.  Audible Swallowing: None  Type of Nipple: Everted at rest and after stimulation  Comfort (Breast/Nipple): Soft / non-tender  Hold (Positioning): Assistance needed to correctly position infant at breast and maintain latch.  LATCH Score: 6   Lactation Tools Discussed/Used    Interventions Interventions: Breast feeding basics reviewed;Skin to skin;Breast compression;Adjust position;Support pillows;Position options;Education  Discharge Pump: Personal  Consult Status Consult Status: Follow-up from L&D Date: 04/26/22 Follow-up type: In-patient    Maryruth Hancock [facility] 04/26/2022, 7:22 PM

## 2022-04-27 LAB — CBC
HCT: 31.1 % — ABNORMAL LOW (ref 36.0–46.0)
Hemoglobin: 10.6 g/dL — ABNORMAL LOW (ref 12.0–15.0)
MCH: 29.4 pg (ref 26.0–34.0)
MCHC: 34.1 g/dL (ref 30.0–36.0)
MCV: 86.1 fL (ref 80.0–100.0)
Platelets: 151 10*3/uL (ref 150–400)
RBC: 3.61 MIL/uL — ABNORMAL LOW (ref 3.87–5.11)
RDW: 15.1 % (ref 11.5–15.5)
WBC: 14.8 10*3/uL — ABNORMAL HIGH (ref 4.0–10.5)
nRBC: 0 % (ref 0.0–0.2)

## 2022-04-27 LAB — BIRTH TISSUE RECOVERY COLLECTION (PLACENTA DONATION)

## 2022-04-27 NOTE — Lactation Note (Addendum)
This note was copied from a baby's chart. Lactation Consultation Note  Patient Name: Deanna Willis [id number] Date: 04/27/2022 Reason for consult: Follow-up assessment;Term Age:34 hours   P3: Term baby at 40+0 weeks Feeding preference: Breast  Baby was in the nursery receiving a circumcision when I arrived.  Birth parent reported that baby has been latching and feeding well since delivery.  LATCH scores 9-10; voiding/stooling.  Encouraged feeding 8-12 times/24 hours or sooner if baby shows cues.  Discussed the possibility of cluster feeding tonight and possible sleepiness after the circumcision.  Suggested birth parent call her RN/LC for any questions/concerns or latch assistance.  Praised birth parent for her breast feeding skills.  Birth parent requested a manual pump.  Provided pump, however, birth parent not interested in reviewing set up stating she was familiar with this pump.  Asked her to call for a flange size fit if uncertain.  Support person and another family member present; visitors expected this afternoon.  Reminded birth parent about adequate nutrition, hydration and rest.     Maternal Data Has patient been taught Hand Expression?: Yes Does the patient have breastfeeding experience prior to this delivery?: Yes How long did the patient breastfeed?: 1-1/2 years with each of her other two children  Feeding Mother's Current Feeding Choice: Breast Milk  LATCH Score                    Lactation Tools Discussed/Used    Interventions Interventions: Breast feeding basics reviewed;Education  Discharge Pump: Personal WIC Program: No  Consult Status Consult Status: Follow-up Follow-up type: In-patient    Dora Sims 04/27/2022, 12:44 PM

## 2022-04-27 NOTE — Social Work (Signed)
CSW received consult for hx Anxiety. CSW met with MOB to offer support and complete assessment.    CSW met with MOB at bedside and introduced CSW role. CSW observed MOB holding the infant and FOB present at bedside. MOB presented pleasant and welcomed CSW visit with FOB present. CSW explained the reason for the visit. MOB expressed that she had a lot of anxiety during the pregnancy. She stated, " I worried about everything." MOB reported that she worried about her day to day now with three children. MOB reported that towards the end of the pregnancy she felt her mood improve and now feels a lot better. MOB reported that she was taking Hydroxyzine to treat anxiety however she stopped taking it because she did not feel it helped with her anxiety. MOB reported that she was seeing a therapist but stopped due to insurance. MOB reported that her provider gave information for fee for service therapist that she can access when needed. MOB expressed that she has a great support system that consists of her spouse, family, friends, and church family. MOB reported that she also copes by typing out her thoughts on her phone. CSW encouraged MOB efforts. FOB politely interjected and praised MOB for her efforts and expressed the positive changes they have made as family that has help decrease her anxiety. CSW discussed PPD/anxiety symptoms. CSW provided education regarding the baby blues period vs. perinatal mood disorders, discussed treatment and gave resources for mental health follow up if concerns arise. CSW recommended MOB complete a self-evaluation during the postpartum time period using the New Mom Checklist from Postpartum Progress and encouraged MOB to contact a medical professional if symptoms are noted at any time. CSW assessed MOB for safety. MOB denied thoughts of harm to self and others.   MOB reported that she has all items for the infant including a bassinet where the infant will sleep. CSW provided review of  Sudden Infant Death Syndrome (SIDS) precautions. MOB has chosen [facility] in [place] for infant's follow up care. CSW assessed MOB for additional needs. MOB reported no further need.   CSW identifies no further need for intervention and no barriers to discharge at this time.   Kathrin Greathouse, MSW, LCSW [facility] [place] Worker  [phone] 04/27/2022  5:42 PM

## 2022-04-27 NOTE — Progress Notes (Signed)
   PPD#1 S/P NSVD  Live born female  Birth Weight: 9 lb 10.5 oz (4380 g) APGAR: 9, 9  Newborn Delivery   Birth date/time: 04/26/2022 17:57:00 Delivery type: Vaginal, Spontaneous     Baby name: Maisie Fus  Delivering provider: Arlan Organ C   Lacerations: 1st degree   Circumcision: Yes, planning  Feeding: breast  Pain control at delivery: None   S:  Reports feeling well.             Tolerating PO/No nausea or vomiting             Bleeding is light             Pain controlled with acetaminophen and ibuprofen (OTC)             Up ad lib/ambulatory/voiding without difficulties   O:  A & O x 3, in no apparent distress  Vitals:   04/26/22 2130 04/27/22 0110 04/27/22 0430 04/27/22 0438  BP: 119/76 116/83 98/65   Pulse: 87 72 65   Resp: 18 18 18 20   Temp: 98.1 F (36.7 C) 97.6 F (36.4 C) 98.1 F (36.7 C)   TempSrc: Oral Oral Oral   SpO2: 98%  98% 100%  Weight:      Height:       Recent Labs    04/26/22 0959 04/27/22 0515  WBC 10.3 14.8*  HGB 12.9 10.6*  HCT 38.2 31.1*  PLT 160 151    Blood type: --/--/A POS (08/03 0959)  Rubella: Immune (02/14 0000)   I&O: I/O last 3 completed shifts: In: 0  Out: 400 [Blood:400]          No intake/output data recorded.  Gen: AAO x 3, NAD Abdomen: soft, non-tender, non-distended Fundus: firm, non-tender, U-1 Perineum: intact Lochia: small Extremities: no edema, no calf pain or tenderness   A/P:  PPD # 1 35 y.o., 03-24-1974  Principal Problem:   Postpartum care following vaginal delivery 8/3  Doing well - stable status  Routine post partum orders Active Problems:   Left sided ulcerative (chronic) colitis w/ appendiceal inflammation - In remission, used Remicade in pregnancy   History of postpartum hemorrhage - retained placenta   SVD (spontaneous vaginal delivery)   History of postpartum depression, currently pregnant  Close PP follow-up   Maternal anemia, antepartum  Stable  Continue PO iron after discharge   Perineal  laceration with delivery, first degree  Discussed perineal care and comfort measures.   Anticipate discharge tomorrow.   10/3, MSN, CNM 04/27/2022, 10:31 AM

## 2022-04-28 MED ORDER — ACETAMINOPHEN 500 MG PO TABS: 1000.0000 mg | ORAL_TABLET | Freq: Four times a day (QID) | ORAL | 0 refills | Status: AC | PRN

## 2022-04-28 NOTE — Lactation Note (Signed)
This note was copied from a baby's chart. Lactation Consultation Note  Patient Name: Deanna Willis SLHTD'S Date: 04/28/2022   Age:34 hours  LC attempted to visit with the dyad, but MD was in the room. Will follow up later.   Maternal Data    Feeding    LATCH Score                    Lactation Tools Discussed/Used    Interventions    Discharge    Consult Status      [name] 04/28/2022, 11:48 AM

## 2022-04-28 NOTE — Discharge Summary (Signed)
OB Discharge Summary  Patient Name: Deanna Willis DOB: [birth year] MRN: [id number]  Date of admission: 04/26/2022 Delivering MD: Neta Mends   Date of discharge: 04/28/2022  Admitting diagnosis: Indication for care in labor or delivery [O75.9] Intrauterine pregnancy: [redacted]w[redacted]d     Secondary diagnosis:Principal Problem:   Postpartum care following vaginal delivery 8/3 Active Problems:   Left sided ulcerative (chronic) colitis w/ appendiceal inflammation - In remission, used Remicade in pregnancy   History of postpartum hemorrhage - retained placenta   SVD (spontaneous vaginal delivery)   History of postpartum depression, currently pregnant   Maternal anemia, antepartum   Perineal laceration with delivery, first degree  Additional problems:LGO     Discharge diagnosis: Term Pregnancy Delivered                                                                     Post partum procedures: none  Augmentation: AROM and Cytotec  Complications: None  Hospital course:  Induction of Labor With Vaginal Delivery   36 y.o. yo G3P3003 at [redacted]w[redacted]d was admitted to the hospital 04/26/2022 for induction of labor.  Indication for induction: Favorable cervix at term.  Patient had an uncomplicated labor course as follows: Membrane Rupture Time/Date: 10:15 AM ,04/26/2022   Delivery Method:Vaginal, Spontaneous  Episiotomy: None  Lacerations:  1st degree  Details of delivery can be found in separate delivery note.  Patient had a routine postpartum course. Patient is discharged home 04/28/22.  Newborn Data: Birth date:04/26/2022  Birth time:5:57 PM  Gender:Female  Living status:Living  Apgars:9 ,9  Weight:4380 g   Physical exam  Vitals:   04/27/22 0438 04/27/22 1900 04/27/22 2102 04/28/22 0500  BP:  110/79 99/64 110/71  Pulse:  78 79 66  Resp: 20 20 (!) 129 (!) 100  Temp:  98.2 F (36.8 C) 98.4 F (36.9 C) 98.4 F (36.9 C)  TempSrc:  Oral Oral Oral  SpO2: 100% 100% 100% 100%  Weight:       Height:       General: alert, cooperative, and no distress Lochia: appropriate Uterine Fundus: firm Incision: N/A DVT Evaluation: No evidence of DVT seen on physical exam. Labs: Lab Results  Component Value Date   WBC 14.8 (H) 04/27/2022   HGB 10.6 (L) 04/27/2022   HCT 31.1 (L) 04/27/2022   MCV 86.1 04/27/2022   PLT 151 04/27/2022      Latest Ref Rng & Units 07/13/2021    8:40 PM  CMP  Glucose 70 - 99 mg/dL 150   BUN 6 - 20 mg/dL 14   Creatinine 5.69 - 1.00 mg/dL 7.94   Sodium 801 - 655 mmol/L 133   Potassium 3.5 - 5.1 mmol/L 4.3   Chloride 98 - 111 mmol/L 101   CO2 22 - 32 mmol/L 25   Calcium 8.9 - 10.3 mg/dL 9.3   Total Protein 6.5 - 8.1 g/dL 8.0   Total Bilirubin 0.3 - 1.2 mg/dL 0.7   Alkaline Phos 38 - 126 U/L 51   AST 15 - 41 U/L 25   ALT 0 - 44 U/L 21     Discharge instruction: per After Visit Summary and "Baby and Me Booklet".  After Visit Meds:  Allergies as of  04/28/2022       Reactions   Mesalamine Er Other (See Comments)   Tablet form worsens colitis symptoms   Phenergan [promethazine Hcl] Other (See Comments)   Fainting, syncope        Medication List     TAKE these medications    acetaminophen 500 MG tablet Commonly known as: TYLENOL Take 2 tablets (1,000 mg total) by mouth every 6 (six) hours as needed for headache. What changed:  how much to take when to take this   calcium carbonate 750 MG chewable tablet Commonly known as: TUMS EX Chew 2,250 mg by mouth as needed for heartburn.   inFLIXimab in sodium chloride 0.9 % Inject into the vein every 8 (eight) weeks.   iron polysaccharides 150 MG capsule Commonly known as: NIFEREX Take 150 mg by mouth daily.   PRENATAL GUMMIES PO Take 3 tablets by mouth daily.        Diet: routine diet  Activity: Advance as tolerated. Pelvic rest for 6 weeks.   Outpatient follow up:6 weeks Follow up Appt:No future appointments. Follow up visit: No follow-ups on file.  Postpartum  contraception: Not Discussed  Newborn Data: Live born female  Birth Weight: 9 lb 10.5 oz (4380 g) APGAR: 9, 9  Newborn Delivery   Birth date/time: 04/26/2022 17:57:00 Delivery type: Vaginal, Spontaneous      Baby Feeding: Breast Disposition:home with mother   04/28/2022 Lendon Colonel, MD

## 2022-04-28 NOTE — Lactation Note (Signed)
This note was copied from a baby's chart. Lactation Consultation Note  Patient Name: Deanna Willis [id number] Date: 04/28/2022 Reason for consult: Follow-up assessment;Term;Infant weight loss;Breastfeeding assistance (6.05% WL) Age:34 hours  P3, Term, Infant Female, 6.05% WL  LC entered the room and baby was being held by the birthing parent. The birthing parent states that baby has been doing well at the breast. LC spoke with the birthing parent about engorgement, warning signs, and pumping.   The birthing parent states that she had issues with mastitis, oversupply, and clogged ducts with her other children.   LC encouraged the birthing parent to wait until her supply is established to pump.   LC also spoke with her about feeding from one breast at each feeding and speaking with an outpatient LC.   The birthing parent states that she has an outpatient LC that she will be seeing when she returns home.   Current Feeding Plan:  Breast/chest feed according to feeding cues 8+ times in 24 hours.  Watch infant output and call pediatrician with questions or concerns.  Call outpatient LC for assistance with breast/ chest feeding.   Maternal Data    Feeding    LATCH Score                    Lactation Tools Discussed/Used    Interventions Interventions: Breast feeding basics reviewed;Education  Discharge Discharge Education: Engorgement and breast care;Warning signs for feeding baby;Outpatient recommendation  Consult Status Consult Status: Complete Date: 04/28/22 Follow-up type: Call as needed    [name] 04/28/2022, 12:53 PM

## 2022-05-04 ENCOUNTER — Telehealth (HOSPITAL_COMMUNITY): Payer: Self-pay | Admitting: *Deleted

## 2022-05-04 NOTE — Telephone Encounter (Signed)
Left phone voicemail message.  Duffy Rhody, RN 05-04-2022 at 9:37am

## 2022-10-02 IMAGING — US US EXTREM LOW VENOUS*R*
1 series · 13 of 24 positions shown · non-contrast
Comparison: None.

CLINICAL DATA: Right leg pain and swelling for 1 week



[Series 1: us venous img lower uni right (dvt) · portal-venous · 13 of 33 slices shown]
[im 1/33]
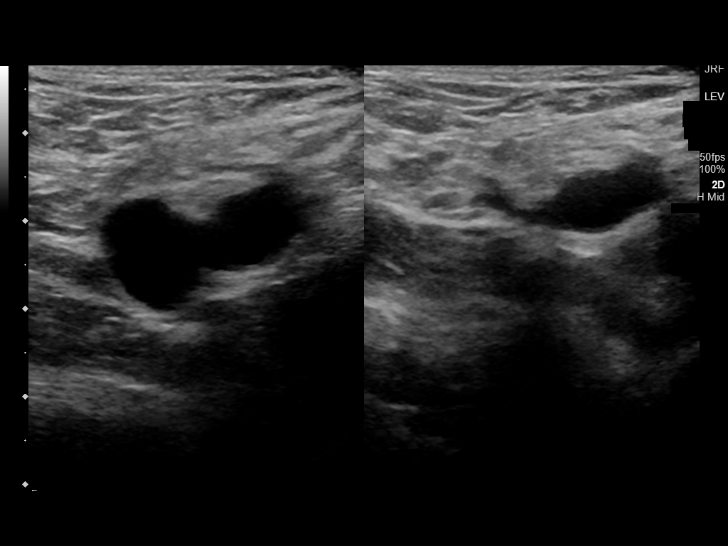
[im 3/33]
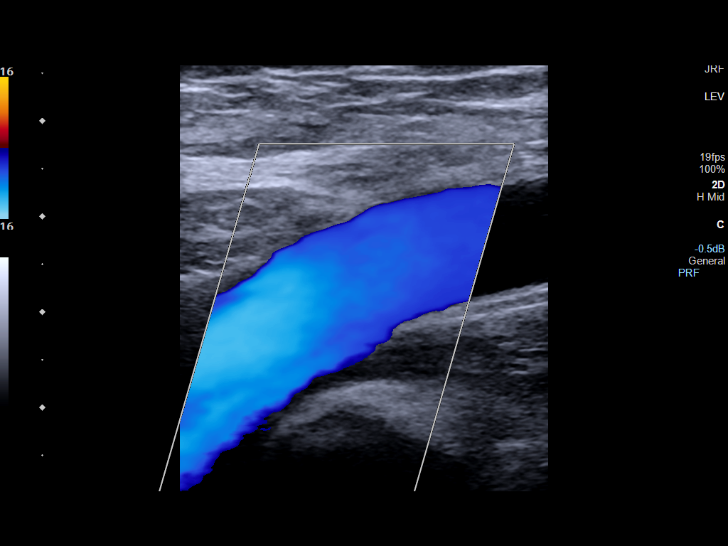
[im 6/33]
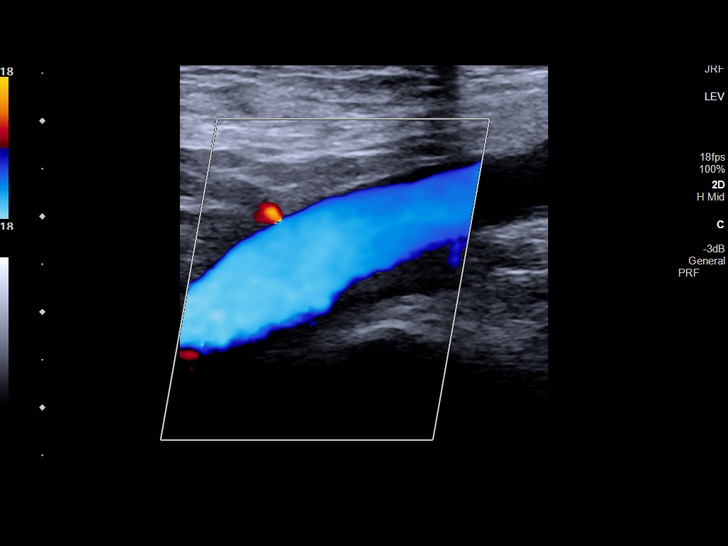
[im 9/33]
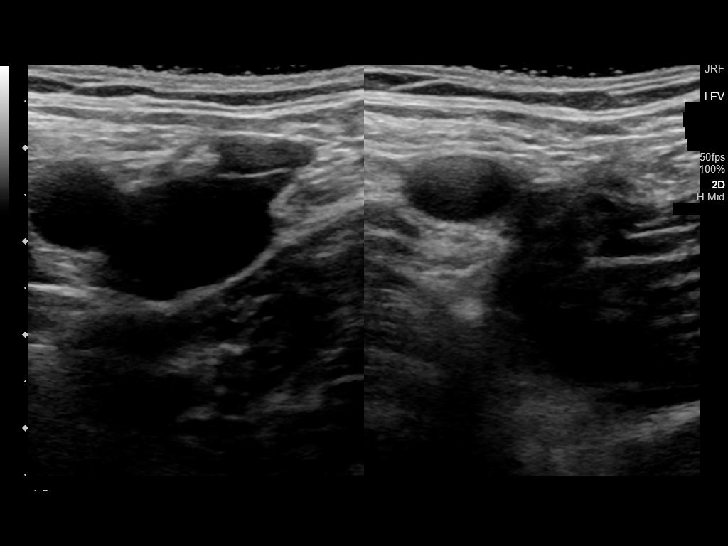
[im 12/33]
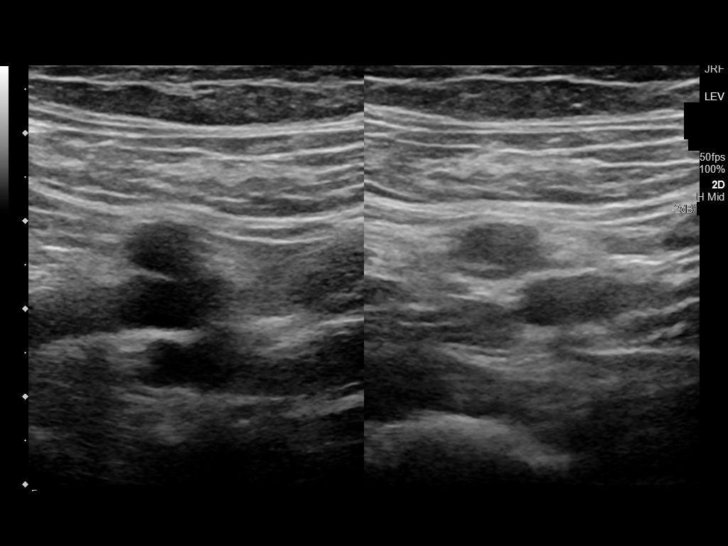
[im 14/33]
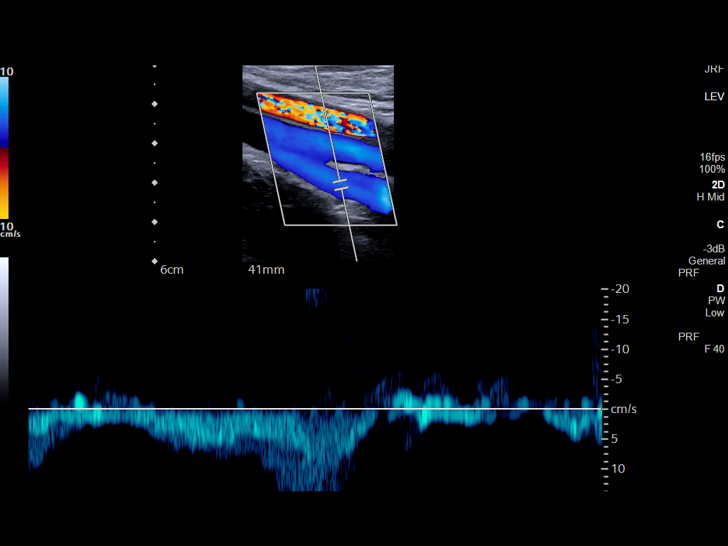
[im 17/33]
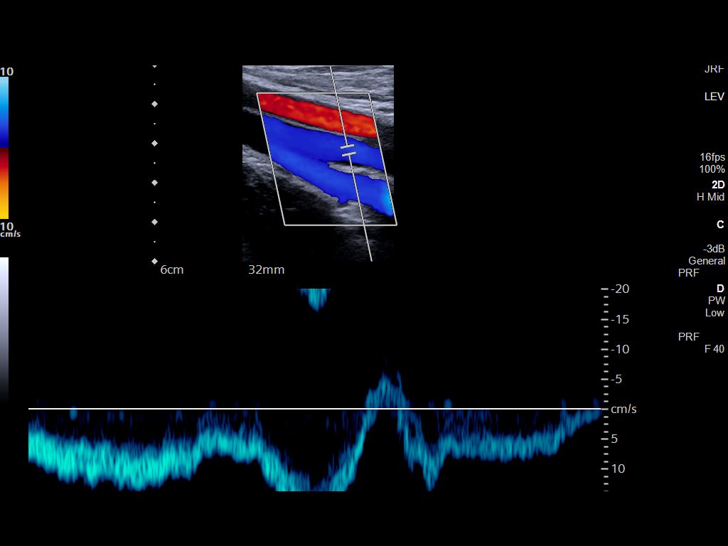
[im 19/33]
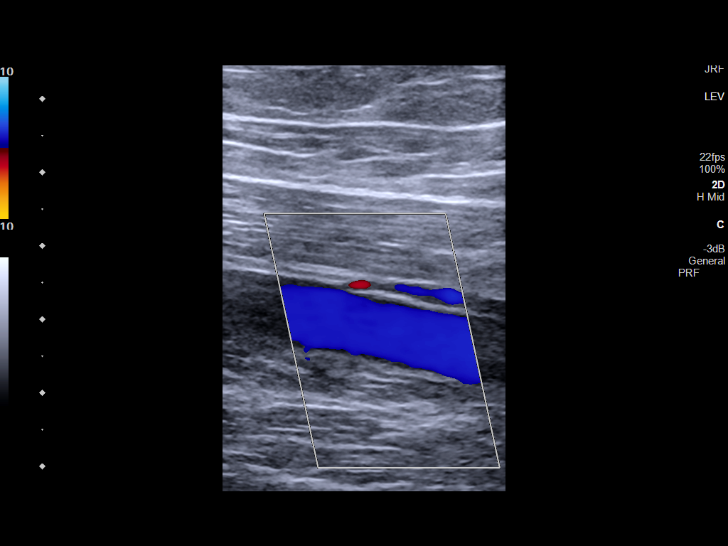
[im 21/33]
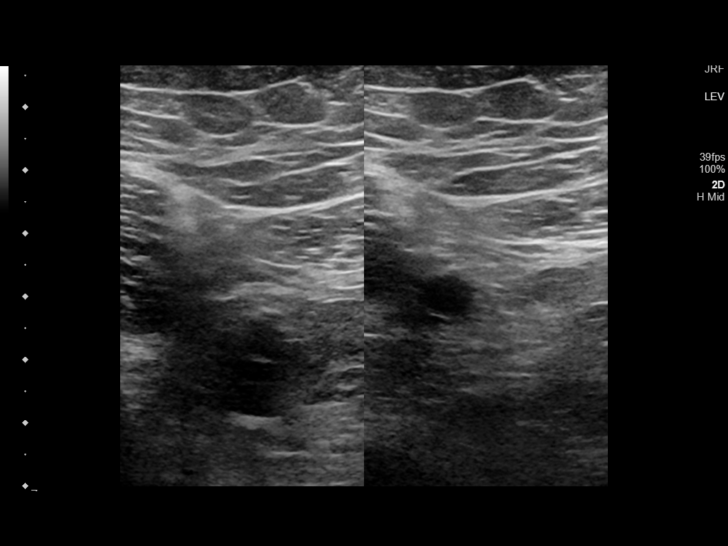
[im 24/33]
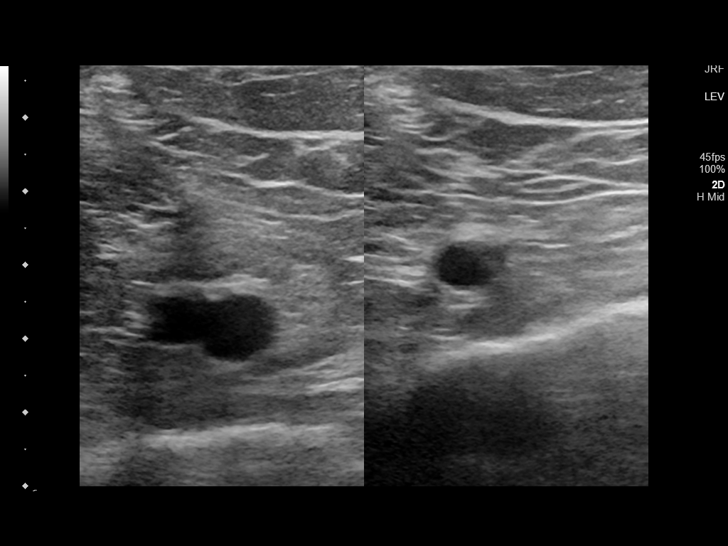
[im 27/33]
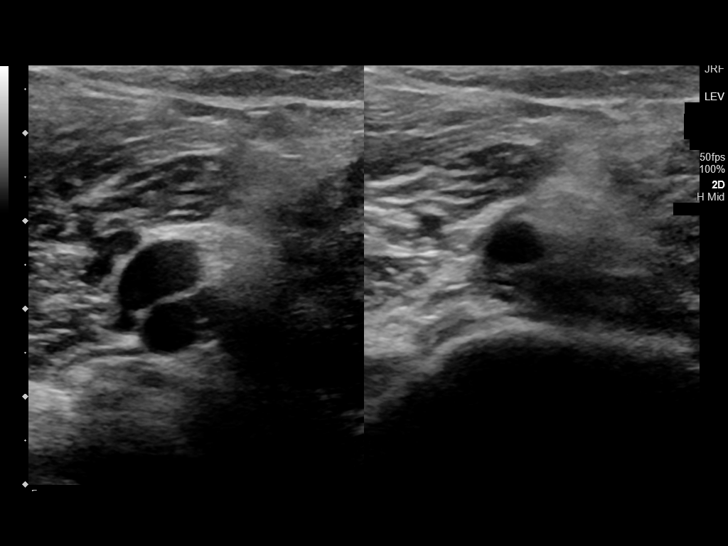
[im 30/33]
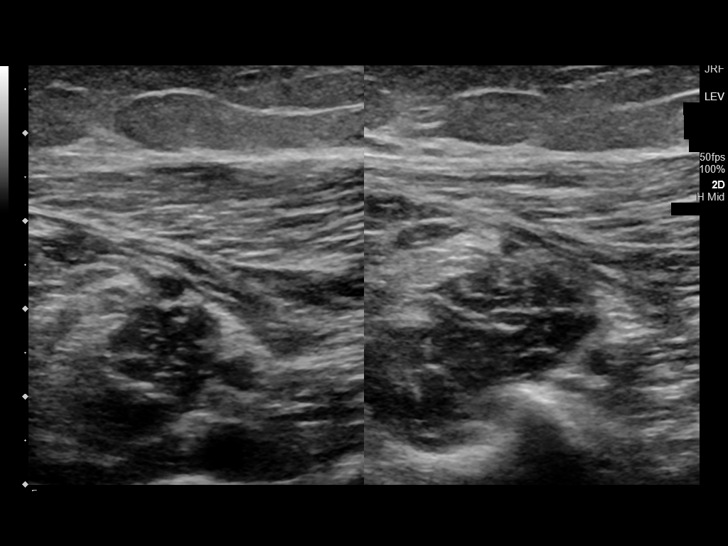
[im 33/33]
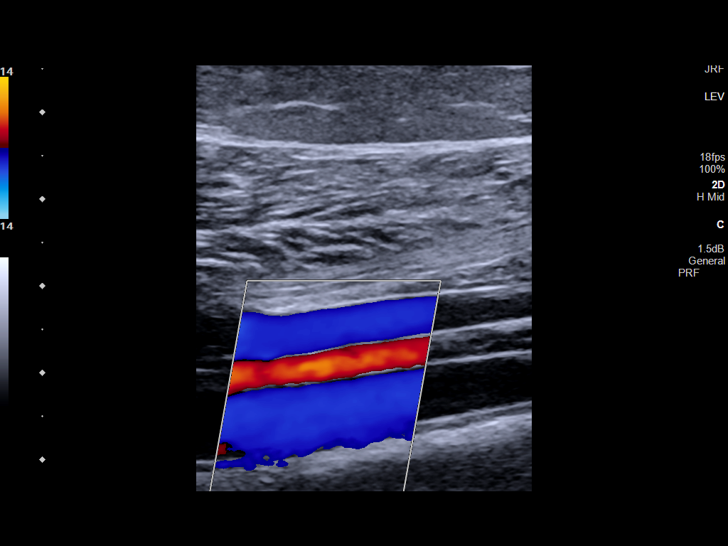

[13 of 24 positions shown; findings below may reference images not displayed]

FINDINGS: Contralateral Common Femoral Vein: Respiratory phasicity is normal
and symmetric with the symptomatic side. No evidence of thrombus.
Normal compressibility.

Common Femoral Vein: No evidence of thrombus. Normal
compressibility, respiratory phasicity and response to augmentation.

Saphenofemoral Junction: No evidence of thrombus. Normal
compressibility and flow on color Doppler imaging.

Profunda Femoral Vein: No evidence of thrombus. Normal
compressibility and flow on color Doppler imaging.

Femoral Vein: No evidence of thrombus. Normal compressibility,
respiratory phasicity and response to augmentation.

Popliteal Vein: No evidence of thrombus. Normal compressibility,
respiratory phasicity and response to augmentation.

Calf Veins: No evidence of thrombus. Normal compressibility and flow
on color Doppler imaging.

Superficial Great Saphenous Vein: No evidence of thrombus. Normal
compressibility.

Venous Reflux:  None.

Other Findings:  None.
IMPRESSION: No evidence of deep venous thrombosis.
